# Patient Record
Sex: Male | Born: 1955
Health system: Southern US, Community
[De-identification: ages and names within clinical notes are randomized; demographics above are authoritative.]

## PROBLEM LIST (undated history)

## (undated) ENCOUNTER — Emergency Department (HOSPITAL_COMMUNITY): Admission: EM | Payer: PRIVATE HEALTH INSURANCE

## (undated) DIAGNOSIS — I639 Cerebral infarction, unspecified: Secondary | ICD-10-CM

## (undated) DIAGNOSIS — C801 Malignant (primary) neoplasm, unspecified: Secondary | ICD-10-CM

## (undated) DIAGNOSIS — I10 Essential (primary) hypertension: Secondary | ICD-10-CM

## (undated) HISTORY — PX: PROSTATECTOMY: SHX69

## (undated) HISTORY — DX: Essential (primary) hypertension: I10

## (undated) HISTORY — DX: Malignant (primary) neoplasm, unspecified: C80.1

---

## 1999-09-16 ENCOUNTER — Emergency Department (HOSPITAL_COMMUNITY): Admission: EM | Admit: 1999-09-16 | Discharge: 1999-09-16 | Payer: Self-pay | Admitting: Emergency Medicine

## 2000-09-06 ENCOUNTER — Encounter: Payer: Self-pay | Admitting: General Practice

## 2000-09-06 ENCOUNTER — Encounter: Admission: RE | Admit: 2000-09-06 | Discharge: 2000-09-06 | Payer: Self-pay | Admitting: General Practice

## 2003-07-03 ENCOUNTER — Emergency Department (HOSPITAL_COMMUNITY): Admission: AD | Admit: 2003-07-03 | Discharge: 2003-07-03 | Payer: Self-pay | Admitting: Family Medicine

## 2003-08-02 ENCOUNTER — Encounter: Admission: RE | Admit: 2003-08-02 | Discharge: 2003-09-05 | Payer: Self-pay | Admitting: Nurse Practitioner

## 2009-04-29 ENCOUNTER — Emergency Department (HOSPITAL_BASED_OUTPATIENT_CLINIC_OR_DEPARTMENT_OTHER): Admission: EM | Admit: 2009-04-29 | Discharge: 2009-04-29 | Payer: Self-pay | Admitting: Emergency Medicine

## 2009-04-29 ENCOUNTER — Ambulatory Visit: Payer: Self-pay | Admitting: Radiology

## 2010-06-18 ENCOUNTER — Encounter (INDEPENDENT_AMBULATORY_CARE_PROVIDER_SITE_OTHER): Payer: Self-pay | Admitting: Urology

## 2010-06-18 ENCOUNTER — Inpatient Hospital Stay (HOSPITAL_COMMUNITY)
Admission: RE | Admit: 2010-06-18 | Discharge: 2010-06-19 | Payer: Self-pay | Source: Home / Self Care | Attending: Urology | Admitting: Urology

## 2010-06-20 ENCOUNTER — Inpatient Hospital Stay (HOSPITAL_COMMUNITY)
Admission: EM | Admit: 2010-06-20 | Discharge: 2010-06-26 | Payer: Self-pay | Source: Home / Self Care | Attending: Urology | Admitting: Urology

## 2010-08-04 NOTE — Discharge Summary (Signed)
Ralph Byrd, Ralph Byrd               ACCOUNT NO.:  [id number]  MEDICAL RECORD NO.:  [id number]          PATIENT TYPE:  INP  LOCATION:  1423                         FACILITY:  [facility]  PHYSICIAN:  Valetta Fuller, M.D.  DATE OF BIRTH:  1955-08-02  DATE OF ADMISSION:  06/20/2010 DATE OF DISCHARGE:  06/26/2010                              DISCHARGE SUMMARY   ADMISSION DIAGNOSES: 1. Postoperative ileus, now resolved. 2. Acute abdominal pain. 3. Nonfunctioning indwelling Foley catheter.  DISCHARGE DIAGNOSES: 1. Postoperative ileus, now resolved. 2. Acute abdominal pain. 3. Nonfunctioning indwelling Foley catheter.  HISTORY OF PRESENT ILLNESS:  This is a 55 year old gentleman who is status post robotic-assisted laparoscopic radical prostatectomy on June 18, 2010.  His procedure was uncomplicated, and his hospital course was unremarkable.  He was discharged home from the hospital on June 19, 2010.  At approximately 11:00 p.m. that evening, he began having severe abdominal pain, which was unrelieved with his pain medications.  At that time, he did notice that his Foley catheter had stopped draining.  He had just switched from his leg bag to his large indwelling Foley catheter bag.  He did notice some blood-tinged urine in his bag prior to changing over and prior to the initiation of his symptoms.  He began to complain of nausea with no vomiting at that time. He denies any fever, chills, diarrhea, constipation, chest pain or shortness of breath.  He denies any flatus or bowel movement at this time.  He does complain of hiccups and abdominal distention.  He presented to [facility].  A CT scan of his abdomen and pelvis were obtained, which showed extensive stranding around bladder, which is secondary to his operative procedure.  Also found was mild fullness of the collecting systems bilaterally.  There was moderate ascites, which was best seen adjacent to the liver and  spleen.  His serum creatinine was 1.3 at discharge on June 19, 2010.  In the emergency room, his serum creatinine had subsequently increased to 2.27. His white count is normal, and his urinalysis is normal.  HOSPITAL COURSE:  On June 20, 2010, he was admitted to the hospital for further workup and evaluation.  He was placed nothing by mouth.  He was given medications for pain and for nausea.  He continued to ambulate.  He was given suppositories to try to initiate restart of bowel function.  An acute abdominal series was obtained later that afternoon, which showed gas and stool in proximal colon with no dilated bowel loops.  There was lots of fluid seeping from around one of his incision site in which held a pelvic drain prior to discharge. Therefore, fluid was sent from the dressings at that site and returned at 2.5, which was consistent with urine, suggesting a possible anastomotic leak.  On hospital day #2, a second acute abdominal series was obtained, which showed a borderline dilated small-bowel loops versus focal ileus.  Therefore, a general surgery consult was obtained for this ileus versus a low grade partial small bowel small bowel obstruction. An NG tube was placed prior to request per general surgery.  His  serum creatinine did continue to elevate to 2.75.  He continued to deny any nausea and vomiting.  He also continued not to have bowel movement or pass flatus.  On postoperative day #3, after placement of NG tube, he did state that he felt a little better.  A third acute abdominal series was obtained, which showed no change from the prior day.  His serum creatinine had decreased to 1.85, and his white count continued to be normal.  On hospital day #4, his serum creatinine again decreased to 1.41.  He continued to feel slightly better, although he still was not passing flatus or having bowel movements.  He was started on sips of water and a suppository was given.  He was  encouraged to ambulate.  By hospital day #5, he began passing flatus and having bowel movements.  He stated that he felt much better.  A fourth acute abdominal series was obtained, which did show resolution of dilated small bowel loops compared to his prior exam.  Therefore, his NG tube was clamped until later that afternoon.  Later that afternoon, he continued to feel much better, continued to have bowel movements and denies any nausea or vomiting.  Therefore, his NG tube was removed.  He did have some coffee grounds from his NG tube.  Per general surgery, they were tested and guaiac positive.  General Surgery is aware that.  On hospital day #6, his serum creatinine continued to decrease to 1.29.  He continued to have bowel movements and flatus.  By hospital day #7, his ileus continued to resolve.  He continued to pass gas and have bowel movements.  He was tolerating a clear liquid diet.  He was ambulating without difficulty and his pain was well managed.  Therefore, he was felt to be stable for discharge home as he had met all discharge criteria.  DISCHARGE INSTRUCTIONS:  He was instructed to be ambulatory but specifically told to refrain from any heavy lifting, strenuous activity or driving.  He was again instructed on routine Foley catheter care and told to gradually advance his diet over the course of the next 48 hours.  DISCHARGE MEDICATIONS:  He was instructed to resume all home medications.  In addition, he was provided a prescription for Vicodin to use as needed for pain and told to use Colace as a stool softener.  FOLLOWUP:  He will follow up in 3-5 days for removal of Foley catheter and skin staples.     Delia Chimes, NP   ______________________________ Valetta Fuller, M.D.    MA/MEDQ  D:  07/23/2010  T:  07/23/2010  Job:  [id number]  Electronically Signed by Delia Chimes NP on 07/25/2010 09:46:02 AM Electronically Signed by Barron Alvine M.D. on 08/04/2010  09:21:18 AM

## 2010-09-23 LAB — BASIC METABOLIC PANEL
BUN: 11 mg/dL (ref 6–23)
BUN: 12 mg/dL (ref 6–23)
BUN: 14 mg/dL (ref 6–23)
CO2: 22 mEq/L (ref 19–32)
CO2: 23 mEq/L (ref 19–32)
CO2: 24 mEq/L (ref 19–32)
Calcium: 8.1 mg/dL — ABNORMAL LOW (ref 8.4–10.5)
Calcium: 8.3 mg/dL — ABNORMAL LOW (ref 8.4–10.5)
Calcium: 8.3 mg/dL — ABNORMAL LOW (ref 8.4–10.5)
Calcium: 8.5 mg/dL (ref 8.4–10.5)
Calcium: 8.7 mg/dL (ref 8.4–10.5)
Chloride: 108 mEq/L (ref 96–112)
Chloride: 108 mEq/L (ref 96–112)
Creatinine, Ser: 1.39 mg/dL (ref 0.4–1.5)
Creatinine, Ser: 1.41 mg/dL (ref 0.4–1.5)
Creatinine, Ser: 1.85 mg/dL — ABNORMAL HIGH (ref 0.4–1.5)
GFR calc Af Amer: 54 mL/min — ABNORMAL LOW (ref >60)
GFR calc Af Amer: >60 mL/min (ref >60)
GFR calc non Af Amer: 38 mL/min — ABNORMAL LOW (ref >60)
GFR calc non Af Amer: 52 mL/min — ABNORMAL LOW (ref >60)
GFR calc non Af Amer: 53 mL/min — ABNORMAL LOW (ref >60)
GFR calc non Af Amer: 58 mL/min — ABNORMAL LOW (ref >60)
Glucose, Bld: 117 mg/dL — ABNORMAL HIGH (ref 70–99)
Glucose, Bld: 122 mg/dL — ABNORMAL HIGH (ref 70–99)
Glucose, Bld: 135 mg/dL — ABNORMAL HIGH (ref 70–99)
Glucose, Bld: 142 mg/dL — ABNORMAL HIGH (ref 70–99)
Glucose, Bld: 148 mg/dL — ABNORMAL HIGH (ref 70–99)
Potassium: 4.6 mEq/L (ref 3.5–5.1)
Sodium: 138 mEq/L (ref 135–145)

## 2010-09-23 LAB — COMPREHENSIVE METABOLIC PANEL
ALT: 20 U/L (ref 0–53)
Alkaline Phosphatase: 63 U/L (ref 39–117)
BUN: 14 mg/dL (ref 6–23)
CO2: 27 mEq/L (ref 19–32)
Calcium: 8.1 mg/dL — ABNORMAL LOW (ref 8.4–10.5)
Calcium: 9.4 mg/dL (ref 8.4–10.5)
Creatinine, Ser: 1.32 mg/dL (ref 0.4–1.5)
GFR calc Af Amer: >60 mL/min (ref >60)
GFR calc non Af Amer: 56 mL/min — ABNORMAL LOW (ref >60)
Glucose, Bld: 124 mg/dL — ABNORMAL HIGH (ref 70–99)
Potassium: 4.2 mEq/L (ref 3.5–5.1)
Sodium: 142 mEq/L (ref 135–145)
Total Bilirubin: 0.7 mg/dL (ref 0.3–1.2)
Total Bilirubin: 0.8 mg/dL (ref 0.3–1.2)
Total Protein: 6.2 g/dL (ref 6.0–8.3)
Total Protein: 7.4 g/dL (ref 6.0–8.3)

## 2010-09-23 LAB — DIFFERENTIAL
Basophils Absolute: 0 10*3/uL (ref 0.0–0.1)
Basophils Absolute: 0 10*3/uL (ref 0.0–0.1)
Basophils Relative: 0 % (ref 0–1)
Basophils Relative: 0 % (ref 0–1)
Eosinophils Absolute: 0 10*3/uL (ref 0.0–0.7)
Eosinophils Absolute: 0 10*3/uL (ref 0.0–0.7)
Eosinophils Absolute: 0.1 10*3/uL (ref 0.0–0.7)
Eosinophils Relative: 0 % (ref 0–5)
Eosinophils Relative: 0 % (ref 0–5)
Lymphs Abs: 1.1 10*3/uL (ref 0.7–4.0)
Monocytes Relative: 10 % (ref 3–12)
Monocytes Relative: 2 % — ABNORMAL LOW (ref 3–12)
Monocytes Relative: 6 % (ref 3–12)
Monocytes Relative: 8 % (ref 3–12)
Neutro Abs: 8.2 10*3/uL — ABNORMAL HIGH (ref 1.7–7.7)
Neutrophils Relative %: 80 % — ABNORMAL HIGH (ref 43–77)
Neutrophils Relative %: 88 % — ABNORMAL HIGH (ref 43–77)

## 2010-09-23 LAB — CBC
HCT: 32.3 % — ABNORMAL LOW (ref 39.0–52.0)
HCT: 36.2 % — ABNORMAL LOW (ref 39.0–52.0)
HCT: 37.3 % — ABNORMAL LOW (ref 39.0–52.0)
HCT: 39.8 % (ref 39.0–52.0)
HCT: 43.6 % (ref 39.0–52.0)
Hemoglobin: 11.1 g/dL — ABNORMAL LOW (ref 13.0–17.0)
Hemoglobin: 12.4 g/dL — ABNORMAL LOW (ref 13.0–17.0)
Hemoglobin: 13.8 g/dL (ref 13.0–17.0)
Hemoglobin: 15.4 g/dL (ref 13.0–17.0)
MCH: 29 pg (ref 26.0–34.0)
MCH: 29.1 pg (ref 26.0–34.0)
MCH: 29.5 pg (ref 26.0–34.0)
MCHC: 33.7 g/dL (ref 30.0–36.0)
MCHC: 33.9 g/dL (ref 30.0–36.0)
MCHC: 34 g/dL (ref 30.0–36.0)
MCHC: 34.4 g/dL (ref 30.0–36.0)
MCHC: 34.7 g/dL (ref 30.0–36.0)
MCV: 85 fL (ref 78.0–100.0)
MCV: 85.3 fL (ref 78.0–100.0)
MCV: 85.9 fL (ref 78.0–100.0)
Platelets: 167 10*3/uL (ref 150–400)
Platelets: 177 10*3/uL (ref 150–400)
Platelets: 194 10*3/uL (ref 150–400)
Platelets: 210 10*3/uL (ref 150–400)
Platelets: 225 10*3/uL (ref 150–400)
Platelets: 235 10*3/uL (ref 150–400)
RBC: 3.62 MIL/uL — ABNORMAL LOW (ref 4.22–5.81)
RBC: 4.68 MIL/uL (ref 4.22–5.81)
RBC: 5.11 MIL/uL (ref 4.22–5.81)
RDW: 12.7 % (ref 11.5–15.5)
RDW: 12.8 % (ref 11.5–15.5)
RDW: 12.8 % (ref 11.5–15.5)
RDW: 12.8 % (ref 11.5–15.5)
RDW: 12.9 % (ref 11.5–15.5)
RDW: 13 % (ref 11.5–15.5)
WBC: 4.2 10*3/uL (ref 4.0–10.5)
WBC: 6.9 10*3/uL (ref 4.0–10.5)
WBC: 7.6 10*3/uL (ref 4.0–10.5)

## 2010-09-23 LAB — URINALYSIS, ROUTINE W REFLEX MICROSCOPIC
Ketones, ur: NEGATIVE mg/dL
Urobilinogen, UA: 0.2 mg/dL (ref 0.0–1.0)
pH: 6 (ref 5.0–8.0)

## 2010-09-23 LAB — POCT I-STAT, CHEM 8
BUN: 18 mg/dL (ref 6–23)
Calcium, Ion: 1.01 mmol/L — ABNORMAL LOW (ref 1.12–1.32)
Creatinine, Ser: 2.5 mg/dL — ABNORMAL HIGH (ref 0.4–1.5)
Glucose, Bld: 140 mg/dL — ABNORMAL HIGH (ref 70–99)
HCT: 37 % — ABNORMAL LOW (ref 39.0–52.0)
Hemoglobin: 12.6 g/dL — ABNORMAL LOW (ref 13.0–17.0)
Potassium: 4 mEq/L (ref 3.5–5.1)
Sodium: 134 mEq/L — ABNORMAL LOW (ref 135–145)
TCO2: 24 mmol/L (ref 0–100)

## 2010-09-23 LAB — SURGICAL PCR SCREEN: MRSA, PCR: NEGATIVE

## 2010-09-23 LAB — URINE CULTURE: Culture  Setup Time: 201112090831

## 2010-09-23 LAB — CREATININE, FLUID (PLEURAL, PERITONEAL, JP DRAINAGE)

## 2010-09-23 LAB — OCCULT BLOOD GASTRIC / DUODENUM (SPECIMEN CUP): pH, Gastric: 2

## 2010-09-23 LAB — TYPE AND SCREEN

## 2010-09-23 LAB — URINE MICROSCOPIC-ADD ON

## 2011-08-24 IMAGING — CT CT ABD-PELV W/O CM
2 of 4 series · 17 of 46 positions shown, 19 images · non-contrast
Comparison: None

CLINICAL DATA: Abdominal pain.  Distention.  Elevated creatinine.
History of prostate cancer, status post prostatectomy 06/18/2010.

CT ABDOMEN AND PELVIS WITHOUT CONTRAST
TECHNIQUE: Multidetector CT imaging of the abdomen and pelvis was
performed following the standard protocol without intravenous
contrast.

[Series 2: rtn a/p w/o · axial · non-contrast · 0.73mm/px · z∈[-704,-274]mm · 14 of 96 slices shown, 16 images]
[im 5/96  soft-tissue]
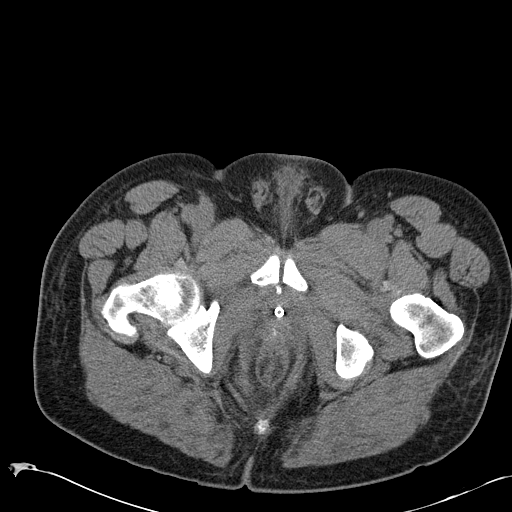
[im 5/96  bone]
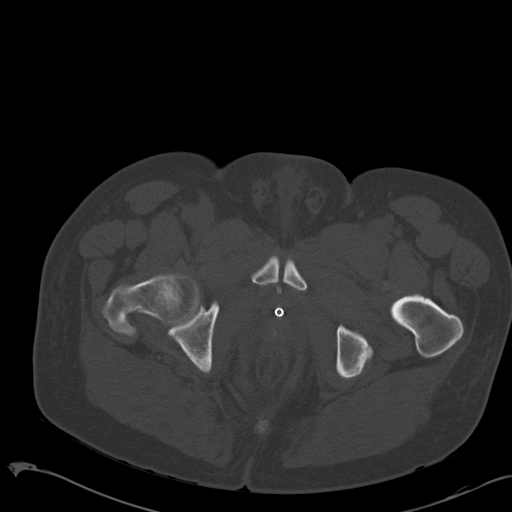
[im 13/96  soft-tissue]
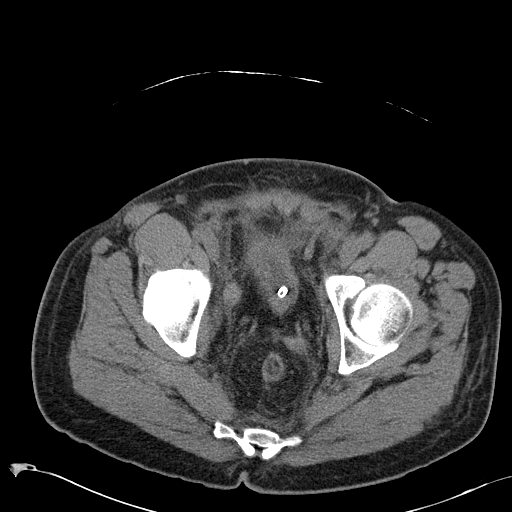
[im 17/96  soft-tissue]
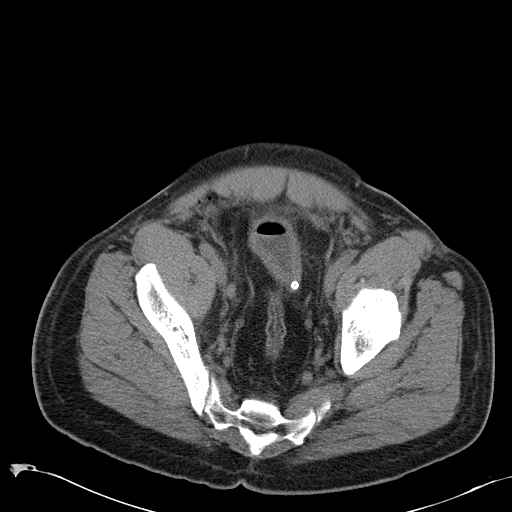
[im 25/96  soft-tissue]
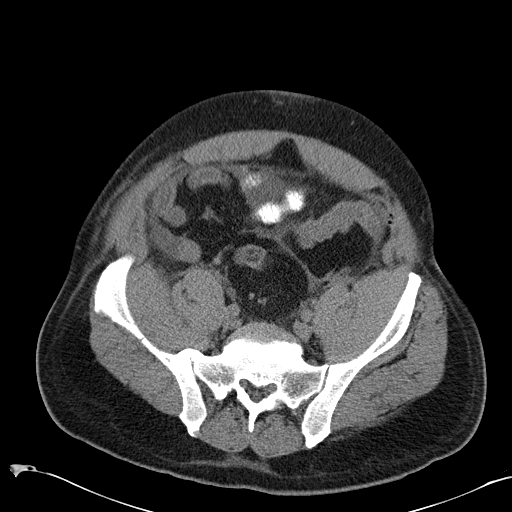
[im 34/96  soft-tissue]
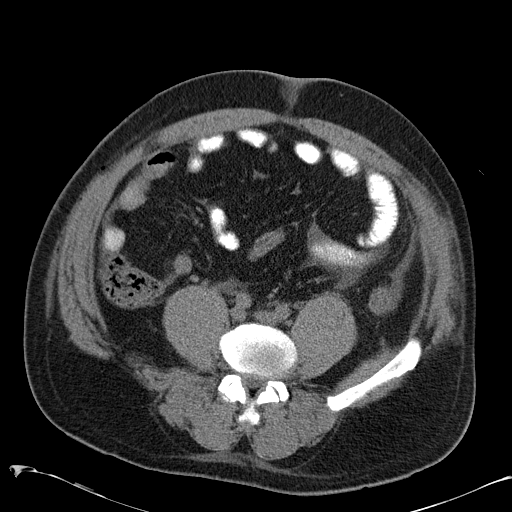
[im 38/96  soft-tissue]
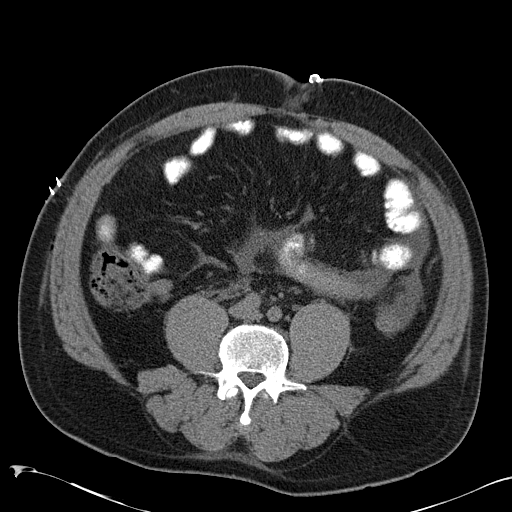
[im 46/96  soft-tissue]
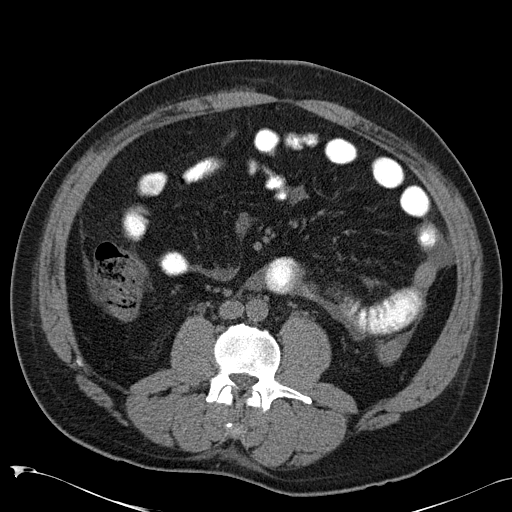
[im 50/96  soft-tissue]
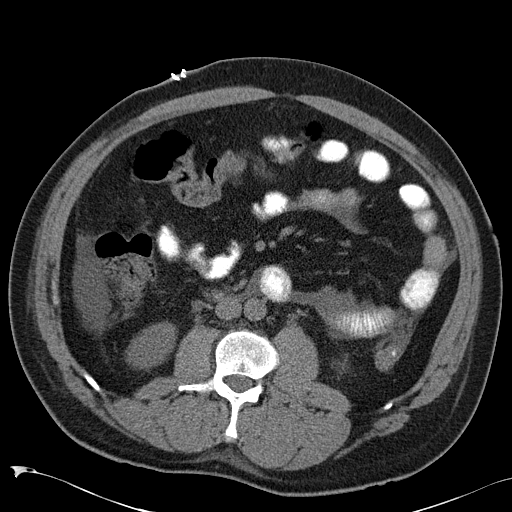
[im 58/96  soft-tissue]
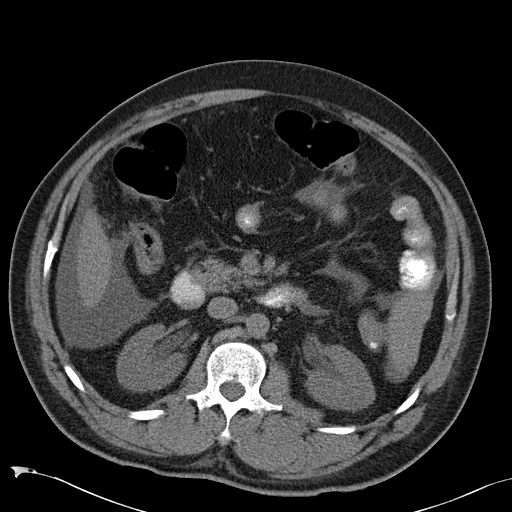
[im 58/96  bone]
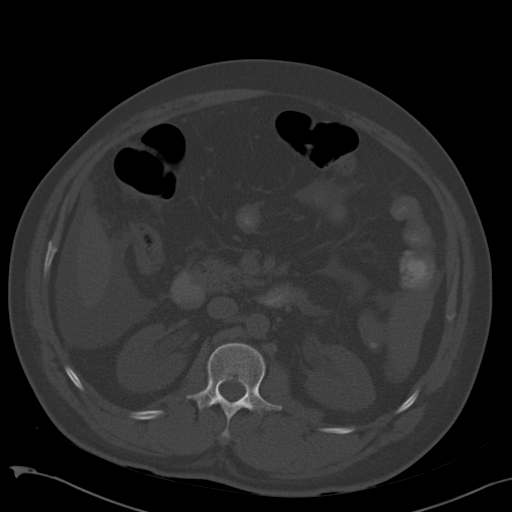
[im 62/96  soft-tissue]
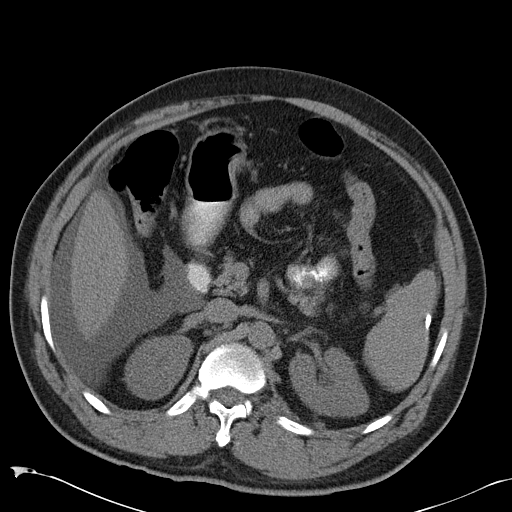
[im 71/96  soft-tissue]
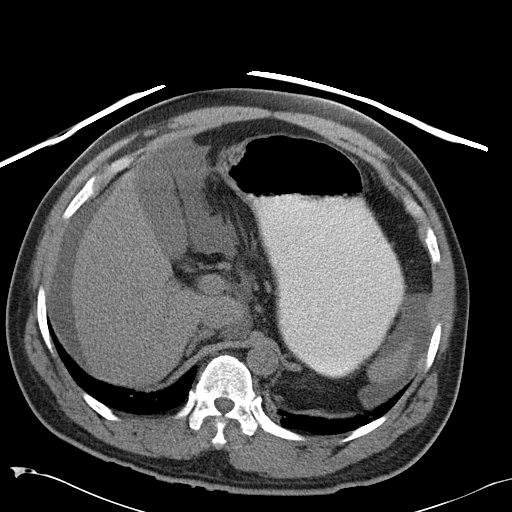
[im 79/96  soft-tissue]
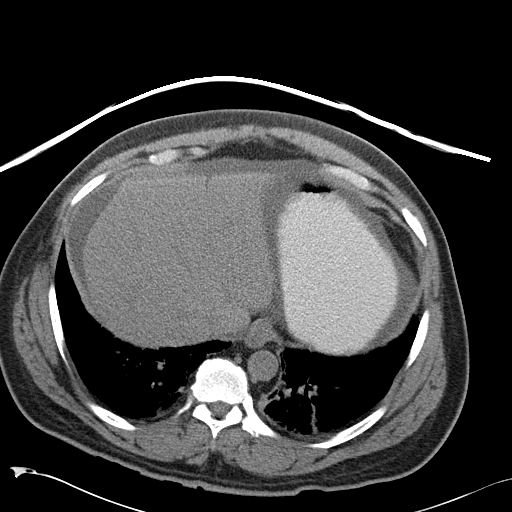
[im 83/96  soft-tissue]
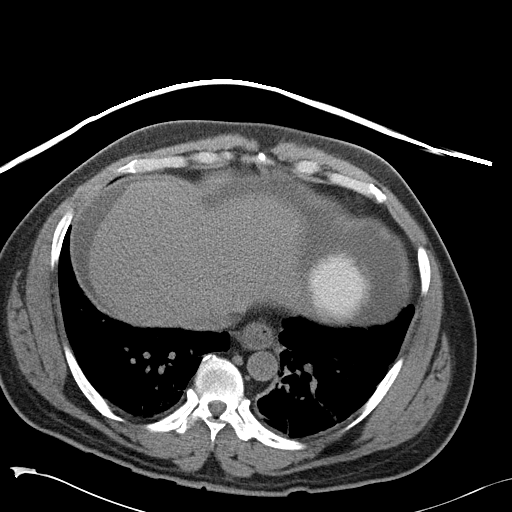
[im 91/96  soft-tissue]
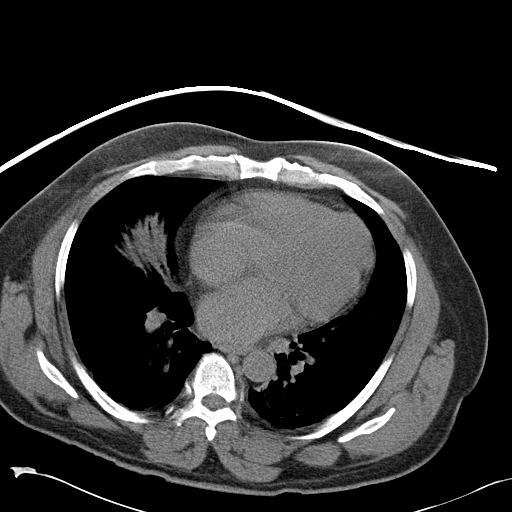

[Series 602: <mpr thick range> · coronal · 0.94mm/px · 3 of 89 slices shown]
[im 30/89  soft-tissue]
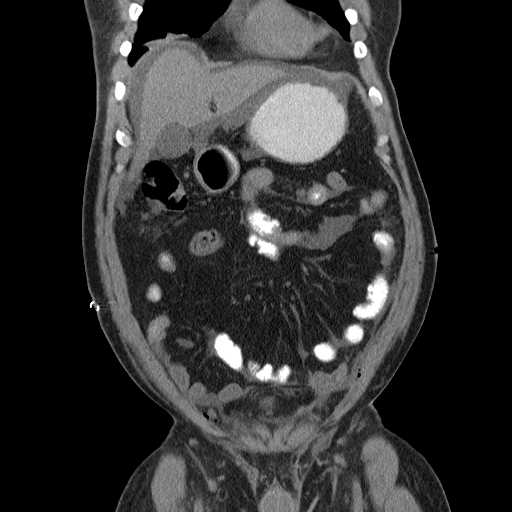
[im 40/89  soft-tissue]
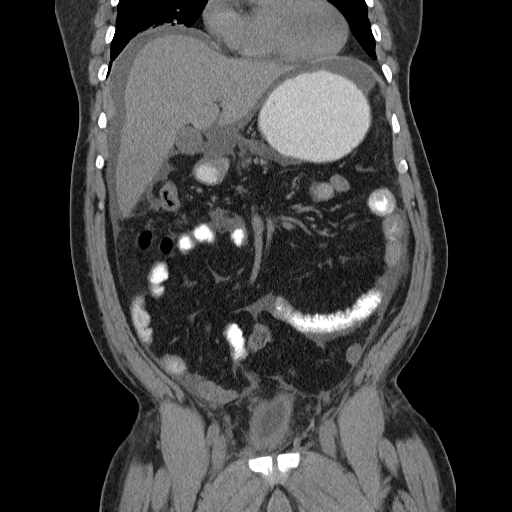
[im 49/89  soft-tissue]
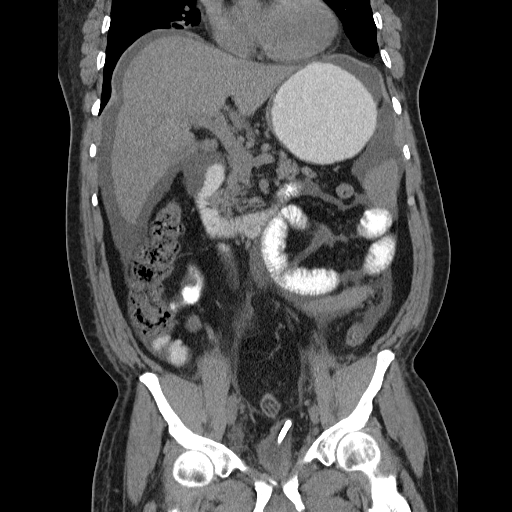

[17 of 46 positions shown; findings below may reference images not displayed]

FINDINGS: Bibasilar opacities are noted, likely atelectasis.  Heart
is mildly enlarged.  No effusions.

Moderate ascites noted in the abdomen, most visible in the
perihepatic and perisplenic regions. There is a catheter present
the bladder which is decompressed.  Bladder wall appears thickened.
There is new stranding in the lower pelvis around the bladder,
presumably due to the recent postoperative state from prior
prostatectomy.  There is a small amount of high-density material
within the pelvis, to the right of the bladder and in the left side
of the cul-de-sac, likely blood/hematoma.  There are a few locules
of extraluminal gas in the pelvis, also presumably postoperative.

Liver, spleen, pancreas, adrenals have an unremarkable unenhanced
appearance.  Mild fullness of the renal collecting systems
bilaterally.  Large and small bowel grossly unremarkable.
IMPRESSION: Extensive stranding around the bladder.  Locules of gas noted
anteriorly in the pelvis.  Small high density collections are seen
to the right of the bladder and in the left cul-de-sac.  This
presumably represents blood/hematoma.  These findings are
presumably related to the recent postoperative state.

Moderate ascites, best seen adjacent to the liver and spleen.

Mild fullness of the collecting systems bilaterally.

Bibasilar atelectasis.

## 2011-08-26 IMAGING — CR DG ABDOMEN ACUTE W/ 1V CHEST
3 series · 3 of 3 positions shown · non-contrast
Comparison: 06/21/2010

CLINICAL DATA: Abdominal distention and pain.  Status post
prostatectomy.

ACUTE ABDOMEN SERIES (ABDOMEN 2 VIEW & CHEST 1 VIEW)

[w chest pa]
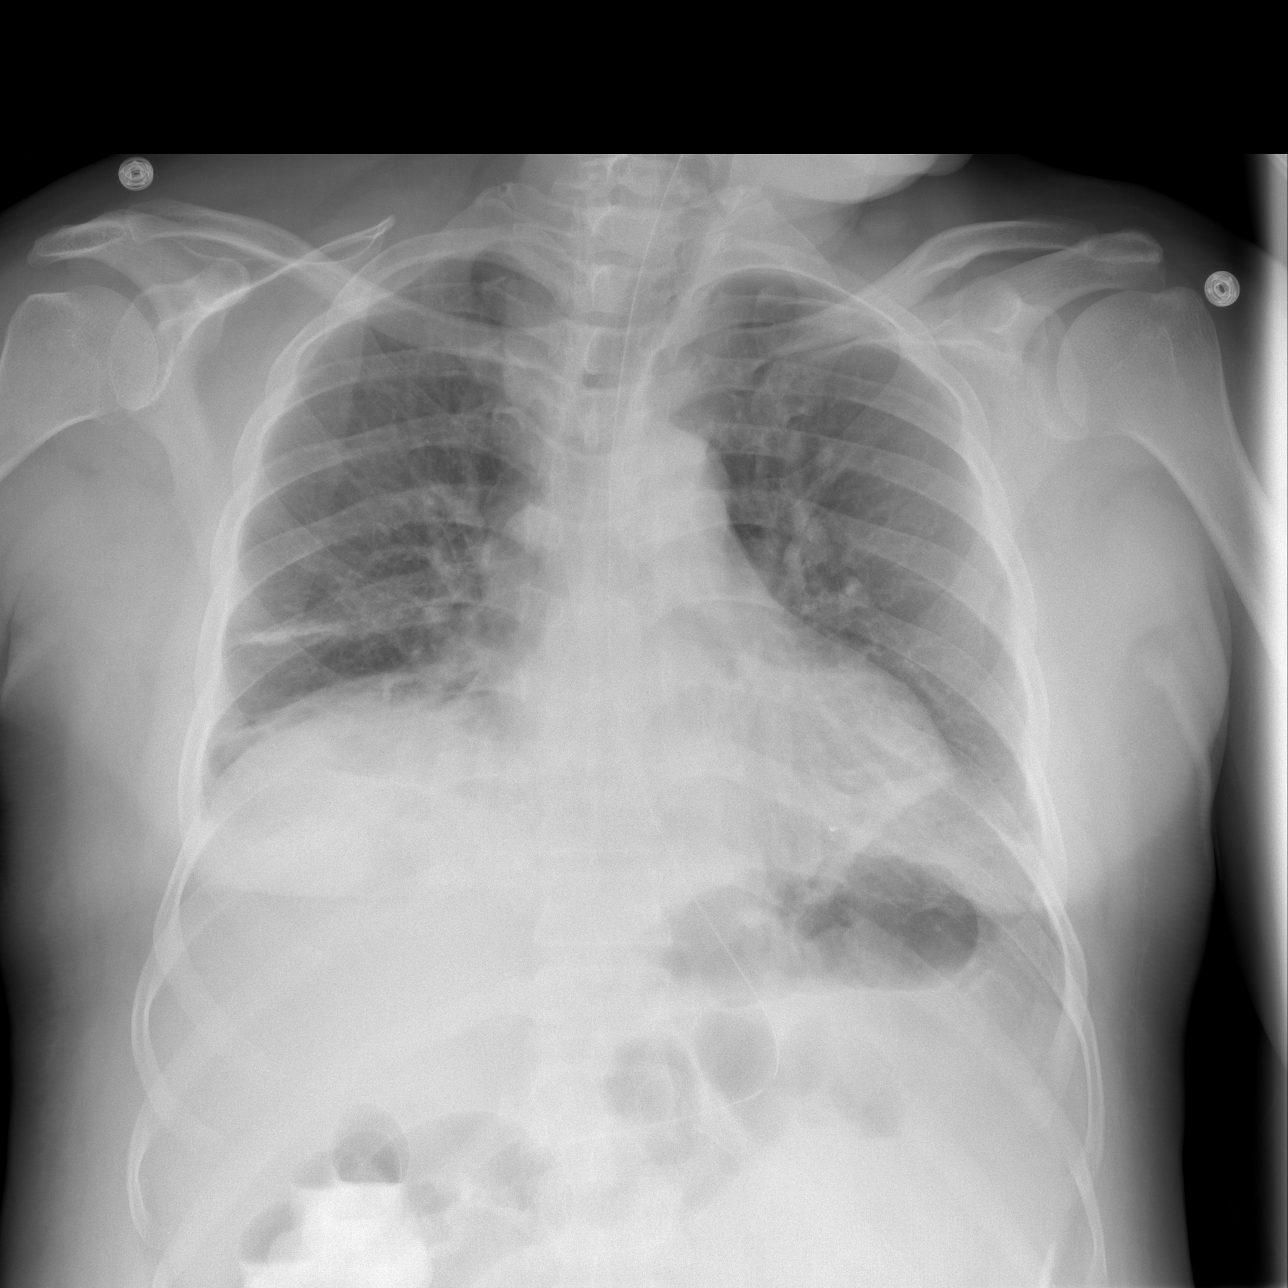

[w abdomen upright *]
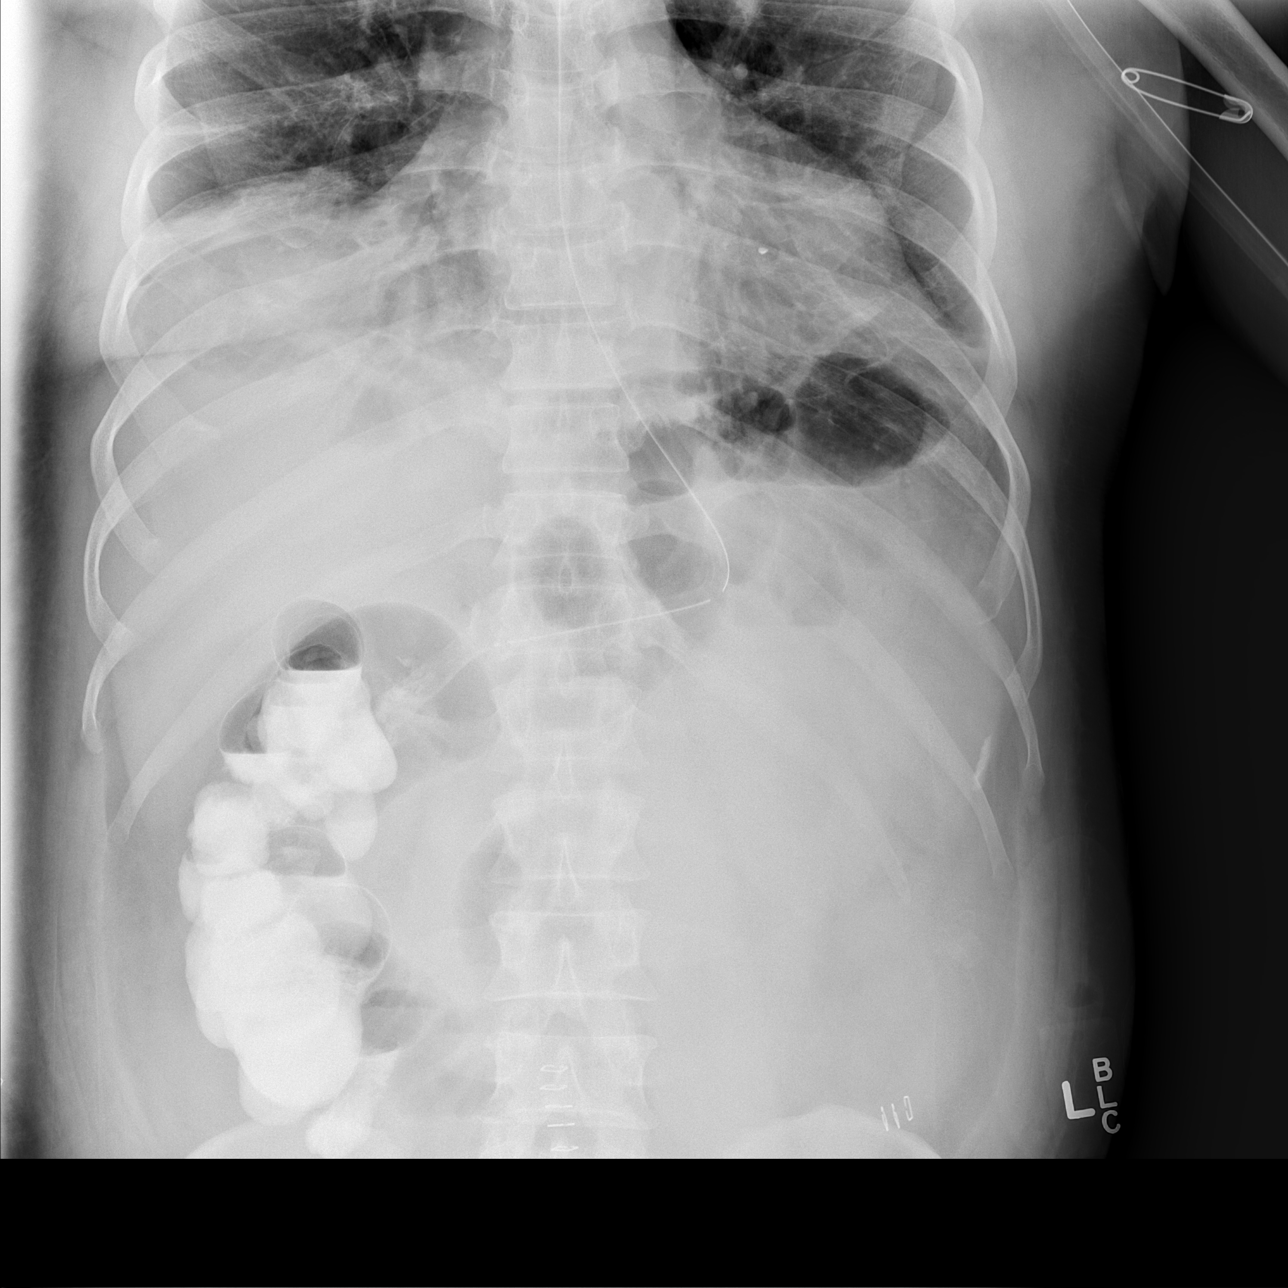

[t abdomen supine *]
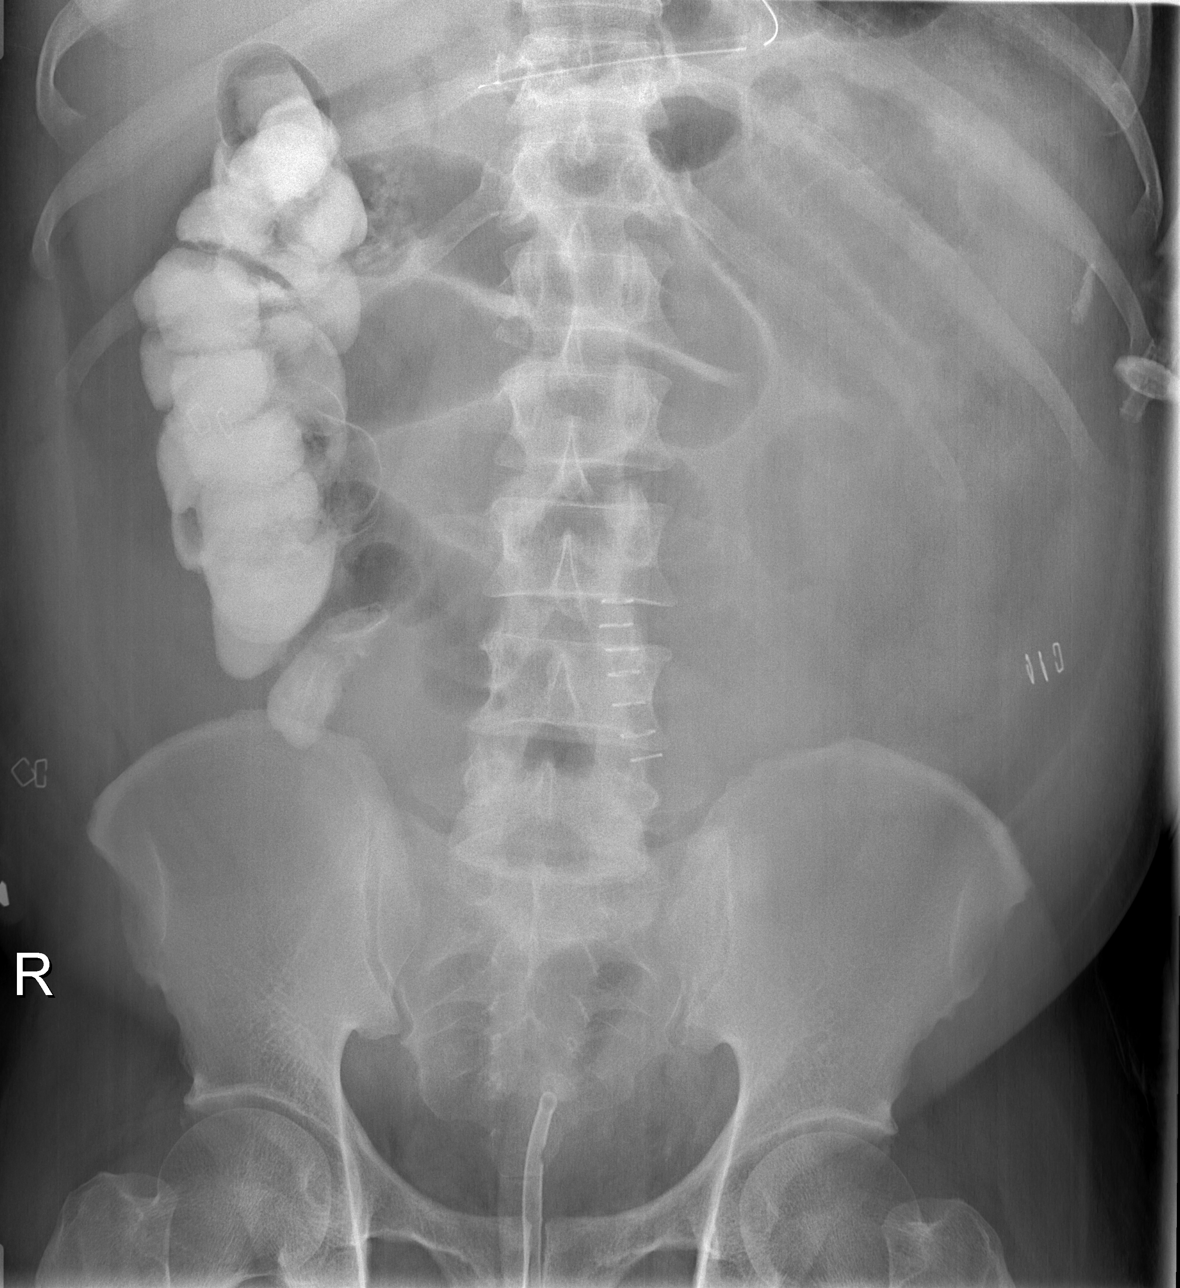

[3 of 3 positions shown; findings below may reference images not displayed]

FINDINGS: No change in low lung volumes with right greater than
left lower lobe plate-like atelectasis.  Nasogastric tube is
appropriately positioned.  No free air beneath the diaphragms.

No significant change in contrast within nondilated right colon.
Stable borderline dilated mid left abdominal loops of small bowel.
A few differential air-fluid levels are again noted.  Abdominal
staples are present.
IMPRESSION: No significant change in several mid left abdominal borderline
dilated small bowel loops of differential air-fluid levels, likely
partial / early small bowel obstruction or focal ileus.

## 2012-09-23 ENCOUNTER — Other Ambulatory Visit (HOSPITAL_COMMUNITY): Payer: Self-pay | Admitting: Family Medicine

## 2012-09-23 DIAGNOSIS — R269 Unspecified abnormalities of gait and mobility: Secondary | ICD-10-CM

## 2012-09-23 DIAGNOSIS — M6281 Muscle weakness (generalized): Secondary | ICD-10-CM

## 2012-09-24 ENCOUNTER — Ambulatory Visit (HOSPITAL_COMMUNITY)
Admission: RE | Admit: 2012-09-24 | Discharge: 2012-09-24 | Disposition: A | Discharge status: Home / Self Care | Payer: 59 | Source: Ambulatory Visit | Attending: Family Medicine | Admitting: Family Medicine

## 2012-09-24 DIAGNOSIS — I6350 Cerebral infarction due to unspecified occlusion or stenosis of unspecified cerebral artery: Secondary | ICD-10-CM | POA: Insufficient documentation

## 2012-09-24 DIAGNOSIS — R269 Unspecified abnormalities of gait and mobility: Secondary | ICD-10-CM

## 2012-09-24 DIAGNOSIS — M6281 Muscle weakness (generalized): Secondary | ICD-10-CM

## 2012-09-24 DIAGNOSIS — R29898 Other symptoms and signs involving the musculoskeletal system: Secondary | ICD-10-CM | POA: Insufficient documentation

## 2012-09-24 MED ORDER — GADOBENATE DIMEGLUMINE 529 MG/ML IV SOLN
20.0000 mL | Freq: Once | INTRAVENOUS | Status: AC | PRN
  Administered 2012-09-24: 20 mL via INTRAVENOUS

## 2012-09-26 ENCOUNTER — Other Ambulatory Visit (HOSPITAL_COMMUNITY): Payer: Self-pay

## 2012-10-10 ENCOUNTER — Other Ambulatory Visit (HOSPITAL_COMMUNITY): Payer: Self-pay | Admitting: Family Medicine

## 2012-10-10 ENCOUNTER — Other Ambulatory Visit (HOSPITAL_COMMUNITY): Payer: Self-pay | Admitting: Unknown Physician Specialty

## 2012-10-10 DIAGNOSIS — I6350 Cerebral infarction due to unspecified occlusion or stenosis of unspecified cerebral artery: Secondary | ICD-10-CM

## 2012-10-13 ENCOUNTER — Ambulatory Visit (HOSPITAL_COMMUNITY)
Admission: RE | Admit: 2012-10-13 | Discharge: 2012-10-13 | Disposition: A | Discharge status: Home / Self Care | Payer: 59 | Source: Ambulatory Visit | Attending: Family Medicine | Admitting: Family Medicine

## 2012-10-13 ENCOUNTER — Ambulatory Visit: Payer: 59 | Admitting: Physical Therapy

## 2012-10-13 DIAGNOSIS — I6350 Cerebral infarction due to unspecified occlusion or stenosis of unspecified cerebral artery: Secondary | ICD-10-CM

## 2012-10-13 DIAGNOSIS — Z8673 Personal history of transient ischemic attack (TIA), and cerebral infarction without residual deficits: Secondary | ICD-10-CM | POA: Insufficient documentation

## 2012-10-13 NOTE — Progress Notes (Signed)
*  PRELIMINARY RESULTS* Vascular Ultrasound Carotid Duplex (Doppler) has been completed.   Bilateral internal carotid arteries demonstrate elevated velocities, however this is likely due to tortuosity. Bilateral carotid sinuses are slightly dilated. Vertebral arteries are patent with antegrade flow.  10/13/2012 11:42 AM Gertie Fey, RDMS, RDCS

## 2012-10-20 ENCOUNTER — Ambulatory Visit: Payer: 59 | Attending: Family Medicine | Admitting: Physical Therapy

## 2012-10-20 DIAGNOSIS — R269 Unspecified abnormalities of gait and mobility: Secondary | ICD-10-CM | POA: Insufficient documentation

## 2012-10-20 DIAGNOSIS — IMO0001 Reserved for inherently not codable concepts without codable children: Secondary | ICD-10-CM | POA: Insufficient documentation

## 2015-07-16 DIAGNOSIS — Z8546 Personal history of malignant neoplasm of prostate: Secondary | ICD-10-CM | POA: Diagnosis not present

## 2015-07-22 DIAGNOSIS — Z8546 Personal history of malignant neoplasm of prostate: Secondary | ICD-10-CM | POA: Diagnosis not present

## 2015-07-22 DIAGNOSIS — N5201 Erectile dysfunction due to arterial insufficiency: Secondary | ICD-10-CM | POA: Diagnosis not present

## 2015-07-22 DIAGNOSIS — Z0000 Encounter for general adult medical examination without abnormal findings: Secondary | ICD-10-CM | POA: Diagnosis not present

## 2015-08-26 MED FILL — HYDROCHLOROTHIAZIDE 25 MG T: 25 | 90 days supply | Qty: 90 | Fill #0

## 2015-09-03 MED FILL — PRAVASTATIN SODIUM 20 MG TA: 20 | 90 days supply | Qty: 90 | Fill #0

## 2015-09-16 MED FILL — AMLODIPINE BESYLATE 5 MG TA: 5 | 90 days supply | Qty: 90 | Fill #0

## 2015-10-02 MED FILL — CLOPIDOGREL 75 MG TABLET: 75 | 90 days supply | Qty: 90 | Fill #1

## 2015-10-31 DIAGNOSIS — N529 Male erectile dysfunction, unspecified: Secondary | ICD-10-CM | POA: Diagnosis not present

## 2015-10-31 DIAGNOSIS — E7800 Pure hypercholesterolemia, unspecified: Secondary | ICD-10-CM | POA: Diagnosis not present

## 2015-10-31 DIAGNOSIS — R7301 Impaired fasting glucose: Secondary | ICD-10-CM | POA: Diagnosis not present

## 2015-10-31 DIAGNOSIS — Z23 Encounter for immunization: Secondary | ICD-10-CM | POA: Diagnosis not present

## 2015-10-31 DIAGNOSIS — I639 Cerebral infarction, unspecified: Secondary | ICD-10-CM | POA: Diagnosis not present

## 2015-10-31 DIAGNOSIS — R21 Rash and other nonspecific skin eruption: Secondary | ICD-10-CM | POA: Diagnosis not present

## 2015-10-31 DIAGNOSIS — I10 Essential (primary) hypertension: Secondary | ICD-10-CM | POA: Diagnosis not present

## 2015-10-31 DIAGNOSIS — Z8546 Personal history of malignant neoplasm of prostate: Secondary | ICD-10-CM | POA: Diagnosis not present

## 2015-10-31 MED FILL — TRIAMCINOLONE 0.1% CREAM: 0.1 | 14 days supply | Qty: 30 | Fill #0

## 2015-11-25 MED FILL — HYDROCHLOROTHIAZIDE 25 MG T: 25 | 90 days supply | Qty: 90 | Fill #0

## 2015-12-17 MED FILL — CLOPIDOGREL 75 MG TABLET: 75 | 90 days supply | Qty: 90 | Fill #0

## 2015-12-17 MED FILL — AMLODIPINE BESYLATE 5 MG TA: 5 | 90 days supply | Qty: 90 | Fill #0

## 2016-01-15 DIAGNOSIS — L509 Urticaria, unspecified: Secondary | ICD-10-CM | POA: Diagnosis not present

## 2016-02-03 MED FILL — PRAVASTATIN SODIUM 20 MG TA: 20 | 90 days supply | Qty: 90 | Fill #0

## 2016-03-04 MED FILL — HYDROCHLOROTHIAZIDE 25 MG T: 25 | 90 days supply | Qty: 90 | Fill #1

## 2016-03-18 MED FILL — AMLODIPINE BESYLATE 5 MG TA: 5 | 90 days supply | Qty: 90 | Fill #1

## 2016-03-18 MED FILL — CLOPIDOGREL 75 MG TABLET: 75 | 90 days supply | Qty: 90 | Fill #1

## 2016-05-01 DIAGNOSIS — R7301 Impaired fasting glucose: Secondary | ICD-10-CM | POA: Diagnosis not present

## 2016-05-01 DIAGNOSIS — E7800 Pure hypercholesterolemia, unspecified: Secondary | ICD-10-CM | POA: Diagnosis not present

## 2016-05-01 DIAGNOSIS — I10 Essential (primary) hypertension: Secondary | ICD-10-CM | POA: Diagnosis not present

## 2016-05-01 DIAGNOSIS — Z8546 Personal history of malignant neoplasm of prostate: Secondary | ICD-10-CM | POA: Diagnosis not present

## 2016-05-01 DIAGNOSIS — N529 Male erectile dysfunction, unspecified: Secondary | ICD-10-CM | POA: Diagnosis not present

## 2016-05-01 DIAGNOSIS — Z8673 Personal history of transient ischemic attack (TIA), and cerebral infarction without residual deficits: Secondary | ICD-10-CM | POA: Diagnosis not present

## 2016-06-01 MED FILL — PRAVASTATIN SODIUM 20 MG TA: 20 | 90 days supply | Qty: 90 | Fill #1

## 2016-06-01 MED FILL — HYDROCHLOROTHIAZIDE 25 MG T: 25 | 90 days supply | Qty: 90 | Fill #2

## 2016-06-26 MED FILL — AMLODIPINE BESYLATE 5 MG TA: 5 | 90 days supply | Qty: 90 | Fill #2

## 2016-06-26 MED FILL — CLOPIDOGREL 75 MG TABLET: 75 | 90 days supply | Qty: 90 | Fill #2

## 2016-07-22 DIAGNOSIS — Z8546 Personal history of malignant neoplasm of prostate: Secondary | ICD-10-CM | POA: Diagnosis not present

## 2016-08-05 DIAGNOSIS — N5201 Erectile dysfunction due to arterial insufficiency: Secondary | ICD-10-CM | POA: Diagnosis not present

## 2016-08-05 DIAGNOSIS — Z8546 Personal history of malignant neoplasm of prostate: Secondary | ICD-10-CM | POA: Diagnosis not present

## 2016-09-07 MED FILL — HYDROCHLOROTHIAZIDE 25 MG T: 25 | 90 days supply | Qty: 90 | Fill #3

## 2016-09-29 MED FILL — CLOPIDOGREL 75 MG TABLET: 75 | 90 days supply | Qty: 90 | Fill #3

## 2016-09-29 MED FILL — AMLODIPINE BESYLATE 5 MG TA: 5 | 90 days supply | Qty: 90 | Fill #3

## 2016-10-30 ENCOUNTER — Other Ambulatory Visit: Payer: Self-pay | Admitting: Family Medicine

## 2016-10-30 ENCOUNTER — Ambulatory Visit
Admission: RE | Admit: 2016-10-30 | Discharge: 2016-10-30 | Disposition: A | Discharge status: Home / Self Care | Payer: 59 | Source: Ambulatory Visit | Attending: Family Medicine | Admitting: Family Medicine

## 2016-10-30 DIAGNOSIS — M25551 Pain in right hip: Secondary | ICD-10-CM | POA: Diagnosis not present

## 2016-10-30 DIAGNOSIS — Z8546 Personal history of malignant neoplasm of prostate: Secondary | ICD-10-CM | POA: Diagnosis not present

## 2016-10-30 DIAGNOSIS — E7800 Pure hypercholesterolemia, unspecified: Secondary | ICD-10-CM | POA: Diagnosis not present

## 2016-10-30 DIAGNOSIS — M47816 Spondylosis without myelopathy or radiculopathy, lumbar region: Secondary | ICD-10-CM | POA: Diagnosis not present

## 2016-10-30 DIAGNOSIS — Z8673 Personal history of transient ischemic attack (TIA), and cerebral infarction without residual deficits: Secondary | ICD-10-CM | POA: Diagnosis not present

## 2016-10-30 DIAGNOSIS — M5440 Lumbago with sciatica, unspecified side: Secondary | ICD-10-CM | POA: Diagnosis not present

## 2016-10-30 DIAGNOSIS — R52 Pain, unspecified: Secondary | ICD-10-CM

## 2016-10-30 DIAGNOSIS — N529 Male erectile dysfunction, unspecified: Secondary | ICD-10-CM | POA: Diagnosis not present

## 2016-10-30 DIAGNOSIS — I10 Essential (primary) hypertension: Secondary | ICD-10-CM | POA: Diagnosis not present

## 2016-10-30 DIAGNOSIS — R7301 Impaired fasting glucose: Secondary | ICD-10-CM | POA: Diagnosis not present

## 2016-10-30 DIAGNOSIS — M25552 Pain in left hip: Secondary | ICD-10-CM | POA: Diagnosis not present

## 2016-10-30 MED FILL — PRAVASTATIN SODIUM 20 MG TA: 20 | 90 days supply | Qty: 90 | Fill #0

## 2016-10-30 MED FILL — predniSONE 20 MG TABS: 20 | 9 days supply | Qty: 18 | Fill #0

## 2016-11-17 ENCOUNTER — Other Ambulatory Visit (HOSPITAL_COMMUNITY): Payer: Self-pay | Admitting: Family Medicine

## 2016-11-17 DIAGNOSIS — M5440 Lumbago with sciatica, unspecified side: Principal | ICD-10-CM

## 2016-11-17 DIAGNOSIS — M545 Low back pain, unspecified: Secondary | ICD-10-CM

## 2016-11-23 ENCOUNTER — Ambulatory Visit (HOSPITAL_COMMUNITY)
Admission: RE | Admit: 2016-11-23 | Discharge: 2016-11-23 | Disposition: A | Discharge status: Home / Self Care | Payer: PRIVATE HEALTH INSURANCE | Source: Ambulatory Visit | Attending: Family Medicine | Admitting: Family Medicine

## 2016-11-23 DIAGNOSIS — M5440 Lumbago with sciatica, unspecified side: Secondary | ICD-10-CM | POA: Diagnosis not present

## 2016-11-23 DIAGNOSIS — M4316 Spondylolisthesis, lumbar region: Secondary | ICD-10-CM | POA: Insufficient documentation

## 2016-11-23 DIAGNOSIS — M5127 Other intervertebral disc displacement, lumbosacral region: Secondary | ICD-10-CM | POA: Insufficient documentation

## 2016-11-23 DIAGNOSIS — M545 Low back pain, unspecified: Secondary | ICD-10-CM

## 2016-11-23 DIAGNOSIS — M48061 Spinal stenosis, lumbar region without neurogenic claudication: Secondary | ICD-10-CM | POA: Diagnosis not present

## 2016-11-26 MED FILL — predniSONE 10 MG TABS: 10 | 12 days supply | Qty: 48 | Fill #0

## 2016-11-30 MED FILL — HYDROCHLOROTHIAZIDE 25 MG T: 25 | 90 days supply | Qty: 90 | Fill #0

## 2016-12-03 DIAGNOSIS — M4317 Spondylolisthesis, lumbosacral region: Secondary | ICD-10-CM | POA: Diagnosis not present

## 2016-12-11 MED FILL — HYDROCODON-APAP 10-325: 10-325 | 5 days supply | Qty: 30 | Fill #0

## 2016-12-25 MED FILL — CLOPIDOGREL 75 MG TABLET: 75 | 90 days supply | Qty: 90 | Fill #0

## 2016-12-25 MED FILL — AMLODIPINE BESYLATE 5 MG TA: 5 | 90 days supply | Qty: 90 | Fill #0

## 2016-12-29 MED FILL — traMADol HCL 50 MG TABS: 50 | 30 days supply | Qty: 60 | Fill #0

## 2016-12-29 MED FILL — GABAPENTIN 300 MG CAPSULE: 300 | 30 days supply | Qty: 90 | Fill #0

## 2017-01-05 ENCOUNTER — Ambulatory Visit: Payer: PRIVATE HEALTH INSURANCE | Attending: Orthopedic Surgery | Admitting: Physical Therapy

## 2017-01-05 ENCOUNTER — Encounter: Payer: Self-pay | Admitting: Physical Therapy

## 2017-01-05 DIAGNOSIS — M6281 Muscle weakness (generalized): Secondary | ICD-10-CM | POA: Insufficient documentation

## 2017-01-05 DIAGNOSIS — R2689 Other abnormalities of gait and mobility: Secondary | ICD-10-CM | POA: Diagnosis present

## 2017-01-05 DIAGNOSIS — M62830 Muscle spasm of back: Secondary | ICD-10-CM | POA: Diagnosis present

## 2017-01-05 DIAGNOSIS — G8929 Other chronic pain: Secondary | ICD-10-CM | POA: Insufficient documentation

## 2017-01-05 DIAGNOSIS — M5442 Lumbago with sciatica, left side: Secondary | ICD-10-CM | POA: Diagnosis present

## 2017-01-05 NOTE — Therapy (Signed)
[place], [place], [address] Phone: [phone]   Fax:  [phone]  Physical Therapy Evaluation  Patient Details  Name: Ralph Byrd MRN: [id number] Date of Birth: [birth year] Referring Provider: Frederik Pear MD  Encounter Date: 01/05/2017      PT End of Session - 01/05/17 0904    Visit Number 1   Number of Visits 12   Date for PT Re-Evaluation 02/16/17   Authorization Type WC   Authorization - Visit Number 1   Authorization - Number of Visits 12   PT Start Time 0906   PT Stop Time 0950   PT Time Calculation (min) 44 min   Activity Tolerance Patient tolerated treatment well;Patient limited by pain   Behavior During Therapy [facility] for tasks assessed/performed      Past Medical History:  Diagnosis Date  . Cancer [facility])    prostate cx, remission in 2012  . Hypertension     History reviewed. No pertinent surgical history.  There were no vitals filed for this visit.       Subjective Assessment - 01/05/17 0903    Subjective pt is a 61 y.o M with CC of low back pain that started 10/14/2016 that occured when he was holding a leg for OR prep at the hospital and he felt pain down both legs and with the L>R.  denies saddle  parestheisa or incontenince. pain goes down the LLe to the toes. Since onset the pain has improved a little since the medication, but getting out of bed is the worst.    Pertinent History hx or cx   Limitations Sitting;Standing;Walking;Other (comment)  getting out of bed.    How long can you sit comfortably? 20 min    How long can you stand comfortably? 5 -10 min   How long can you walk comfortably? 10-15 min   Diagnostic tests MRI and X-ray   Patient Stated Goals to make the pain go away, return to exercise,    Currently in Pain? Yes   Pain Score 6   at worst 8/10   Pain Location Back   Pain Orientation Left;Right;Lower   Pain Descriptors / Indicators Sharp;Aching;Sore   Pain  Radiating Towards LLE to the foot   Pain Onset More than a month ago   Pain Frequency Constant   Aggravating Factors  getting up the morning, getting up from chair, standing, bending forward, all trunk motions   Pain Relieving Factors laying down, resting, medication, kneeling, avoiding putting weight through the LLE   Effect of Pain on Daily Activities limited endurance/ strength in LLE,             [facility] PT Assessment - 01/05/17 0857      Assessment   Medical Diagnosis Low back pain with Radiculitis   Referring Provider Frederik Pear MD   Onset Date/Surgical Date --  10/14/2016   Hand Dominance Right   Next MD Visit 01/19/2017   Prior Therapy yes  2004     Precautions   Precautions Back   Precaution Comments sit down more often at work, avoiding lifting more than 10#,   hx of cx     Restrictions   Weight Bearing Restrictions No     Balance Screen   Has the patient fallen in the past 6 months No   Has the patient had a decrease in activity level because of a fear of falling?  No   Is the patient reluctant to  leave their home because of a fear of falling?  No     Home Environment   Living Environment Private residence   Living Arrangements Spouse/significant other   Available Help at Discharge Family;Available PRN/intermittently   Type of Home House   Home Access Stairs to enter   Entrance Stairs-Number of Steps 12   Entrance Stairs-Rails Right   Home Layout One level     Prior Function   Level of Independence Independent;Independent with basic ADLs   Vocation Full time employment  operating technician   Vocation Requirements lifting/ pushing/ pulling, holding, carrying,    Leisure riding bike, walking, watching sports     Cognition   Overall Cognitive Status Within Functional Limits for tasks assessed     Observation/Other Assessments   Focus on Therapeutic Outcomes (FOTO)  53% limited  predicted 41% limited     Posture/Postural Control   Posture/Postural  Control Postural limitations   Postural Limitations Rounded Shoulders;Forward head     ROM / Strength   AROM / PROM / Strength AROM;Strength     AROM   AROM Assessment Site Lumbar   Lumbar Flexion 55  pain and reproduction of LLE referred sx   Lumbar Extension 10  pinching symptoms at end range   Lumbar - Right Side Bend 10  end range pain   Lumbar - Left Side Bend 18     Strength   Strength Assessment Site Hip;Knee   Right/Left Hip Right;Left   Right Hip Flexion 4/5   Right Hip Extension 3+/5   Right Hip ABduction 4-/5   Right Hip ADduction 3+/5   Left Hip Flexion 3+/5   Left Hip Extension 3+/5   Left Hip ABduction 3/5   Left Hip ADduction 3+/5   Right/Left Knee Right;Left   Right Knee Flexion 4+/5   Right Knee Extension 4+/5   Left Knee Flexion 4-/5   Left Knee Extension 4-/5     Palpation   Spinal mobility hypomobility of L1-L5 PA Paivm with pain during assessment   Palpation comment TTP in the L lumbar paraspinals, tenderness L1-L5 and S1-S2 vertebrae     Special Tests    Special Tests Lumbar   Lumbar Tests Slump Test;Prone Knee Bend Test;Straight Leg Raise     Slump test   Findings Positive   Side Left     Straight Leg Raise   Findings Positive   Side  Left   Comment ipsilateral and contralateral SLR referral pain     Ambulation/Gait   Gait Pattern Step-through pattern;Decreased stride length;Trunk flexed;Decreased trunk rotation;Antalgic;Trendelenburg            Objective measurements completed on examination: See above findings.          [facility] PT Treatment/Exercise - 01/05/17 0857      Lumbar Exercises: Stretches   Prone on Elbows Stretch --  3 x 15    Prone on Elbows Stretch Limitations pillow underhips                 PT Education - 01/05/17 0949    Education provided Yes   Education Details evaluation findings, POC, goals, HEP with proper form/ rationale, anatomy of disc and biomechanics.    Person(s) Educated  Patient   Methods Explanation;Verbal cues;Demonstration  used spine model for demoonstration   Comprehension Verbalized understanding;Verbal cues required;Returned demonstration          PT Short Term Goals - 01/05/17 1000      PT SHORT TERM GOAL #  1   Title pt to be I with inital HEP (01/26/2017)   Time 3   Period Weeks   Status New     PT SHORT TERM GOAL #2   Title pt to verbalize/ demo proper posture/ lifting and carrying mechanics to prevent and reduce low back (01/26/2017)   Time 3   Period Weeks   Status New     PT SHORT TERM GOAL #3   Title pt to reduce low back muscle tightness and guarding to decrease pain to </= 4/10 and promote trunk mobility for therapuetic progression (01/26/2017)   Time 3   Period Weeks   Status New     PT SHORT TERM GOAL #4   Title increase strength in LLE hip extensors/ abductors to >/= 4-/5 to promote hip stability / safety (01/26/2017)   Time 3   Period Weeks   Status New           PT Long Term Goals - 01/05/17 1003      PT LONG TERM GOAL #1   Title pt to improve trunk flexion to >/= 70 degrees and extension/ R side bending by >/= 8 degrees with </= 1/10 pain and no LLE referral for functional trunk mobility (02/16/17)   Time 6   Period Weeks   Status New     PT LONG TERM GOAL #2   Title pt to increase LLE strength to >/= 4+/5 for safety with prolonged walking/ standing activities (02/16/17)   Time 6   Period Weeks   Status New     PT LONG TERM GOAL #3   Title pt to be able to sit, stand/ walk >/= 45 min with </= 1/10 pain and no referral symptoms for functional endurance rquired for work related tasks (02/16/2017)   Time 6   Period Weeks   Status New     PT LONG TERM GOAL #4   Title increase FOTO score to </= 41% limited to demo improvement in function (02/16/17)   Time 6   Period Weeks   Status New     PT LONG TERM GOAL #5   Title pt to be I with all HEP given as of last visit (02/16/17)   Time 6   Period Weeks   Status New                 Plan - 01/05/17 5852    Clinical Impression Statement Mr. Mckelvie presents to OPPT with CC or low back pain with LLE referral to the foot starting 10/14/2016 from non-traumatic injury of holding a pt's leg in the OR and felt pain inthe low back and LLE. He exhibits significant guarding and pain limited all trunk mobility with pain noted in all directions. weakness in the LLE compared bil with pain during testing. Special testing and pt presentation suggest high probability of discogenic pathology.  following prone on elbow pto reported centralizations and improvement in pain. He would benefit from physical therapy to reduce pain, reduce muslce spasm/ improve trunk mobility, increase strength and return pt to PLOF by addressing the deficits listed.    Clinical Presentation Stable   Clinical Decision Making Low   Rehab Potential Good   PT Frequency 2x / week   PT Duration 6 weeks   PT Treatment/Interventions ADLs/Self Care Home Management;Therapeutic activities;Therapeutic exercise;Moist Heat;Traction;Cryotherapy;Electrical Stimulation;Iontophoresis 4mg /ml Dexamethasone;Dry needling;Taping;Manual techniques;Passive range of motion;Patient/family education;Neuromuscular re-education;Gait training;Ultrasound   PT Next Visit Plan assess/ review HEP and  update PRN, extension biased progress  from prone on elbows PRN, core work, trial traction, soft tissue work   PT Home Exercise Plan prone on elbows. pelvic tilt, decompression postion, single knee to chest   Consulted and Agree with Plan of Care Patient      Patient will benefit from skilled therapeutic intervention in order to improve the following deficits and impairments:  Abnormal gait, Pain, Improper body mechanics, Postural dysfunction, Decreased endurance, Decreased activity tolerance, Decreased balance, Decreased strength, Increased fascial restricitons  Visit Diagnosis: Chronic left-sided low back pain with left-sided  sciatica - Plan: PT plan of care cert/re-cert  Muscle spasm of back - Plan: PT plan of care cert/re-cert  Muscle weakness (generalized) - Plan: PT plan of care cert/re-cert  Other abnormalities of gait and mobility - Plan: PT plan of care cert/re-cert     Problem List There are no active problems to display for this patient.  [name] PT, DPT, LAT, ATC  01/05/17  10:09 AM      [facility] [address] [place], [place], [address] Phone: [phone]   Fax:  [phone]  Name: RENZO VINCELETTE MRN: [id number] Date of Birth: Jan 02, 1956

## 2017-01-12 ENCOUNTER — Encounter: Payer: Self-pay | Admitting: Physical Therapy

## 2017-01-12 ENCOUNTER — Ambulatory Visit: Payer: PRIVATE HEALTH INSURANCE | Attending: Family Medicine | Admitting: Physical Therapy

## 2017-01-12 DIAGNOSIS — G8929 Other chronic pain: Secondary | ICD-10-CM | POA: Diagnosis present

## 2017-01-12 DIAGNOSIS — M5442 Lumbago with sciatica, left side: Secondary | ICD-10-CM | POA: Insufficient documentation

## 2017-01-12 DIAGNOSIS — M62830 Muscle spasm of back: Secondary | ICD-10-CM

## 2017-01-12 DIAGNOSIS — R2689 Other abnormalities of gait and mobility: Secondary | ICD-10-CM | POA: Diagnosis present

## 2017-01-12 DIAGNOSIS — M6281 Muscle weakness (generalized): Secondary | ICD-10-CM

## 2017-01-12 NOTE — Therapy (Signed)
[place], [place], [address] Phone: [phone]   Fax:  [phone]  Physical Therapy Treatment  Patient Details  Name: Ralph Byrd MRN: [id number] Date of Birth: Nov 06, 1955 Referring Provider: Frederik Pear MD  Encounter Date: 01/12/2017      PT End of Session - 01/12/17 1117    Visit Number 2   Number of Visits 12   Date for PT Re-Evaluation 02/16/17   Authorization - Visit Number 2   Authorization - Number of Visits 12   PT Start Time 0731   PT Stop Time 0818   PT Time Calculation (min) 47 min   Activity Tolerance Patient tolerated treatment well   Behavior During Therapy [facility] for tasks assessed/performed      Past Medical History:  Diagnosis Date  . Cancer [facility])    prostate cx, remission in 2012  . Hypertension     History reviewed. No pertinent surgical history.  There were no vitals filed for this visit.      Subjective Assessment - 01/12/17 0749    Subjective Exercises help the pain some.  more into back. .  has a question about decompression.  Able to bear a little more weight through leg.   Currently in Pain? Yes   Pain Score 6    Pain Location Back   Pain Orientation Right;Left;Lower   Pain Descriptors / Indicators Aching;Sore;Sharp;Shooting   Pain Type Acute pain   Pain Radiating Towards into foot   Pain Frequency Constant   Aggravating Factors  Moving, sit to stand. moving, rolling   Pain Relieving Factors laying down resting,  meds    Effect of Pain on Daily Activities limited AFDL, sleeping                         [facility] Adult PT Treatment/Exercise - 01/12/17 0001      Lumbar Exercises: Stretches   Pelvic Tilt 5 reps   Prone on Elbows Stretch --  15 X seconds   Press Ups Limitations 15 reps, able to centralize     Lumbar Exercises: Standing   Other Standing Lumbar Exercises McKenzie lateral shifts 3 X able to centralize with increased LBP     Lumbar Exercises: Supine   Other Supine Lumbar Exercises DECOMPRESSION, cues     Lumbar Exercises: Sidelying   Other Sidelying Lumbar Exercises Mckenzie lateral shifting on side over pillow at waist.  able to decrease pain .  tolerated this better than standing.      Modalities   Modalities Electrical Stimulation;Moist Heat     Moist Heat Therapy   Number Minutes Moist Heat 15 Minutes   Moist Heat Location Lumbar Spine  and QL     Electrical Stimulation   Electrical Stimulation Location lumbar and gluteal   Electrical Stimulation Action IFC   Electrical Stimulation Parameters 7   Electrical Stimulation Goals Pain     Manual Therapy   Manual Therapy Soft tissue mobilization  QL, paraspinals light tolerated.   Manual therapy comments strumming illiopsoas both in decompression position.  tissue softened, pulse felt after muscles relaxed                PT Education - 01/12/17 1116    Education provided Yes   Education Details Sleeping position pillow between knees,  May want a pillow at waist on side   Person(s) Educated Patient   Methods Explanation;Demonstration;Verbal cues   Comprehension Verbalized understanding;Returned demonstration  PT Short Term Goals - 01/05/17 1000      PT SHORT TERM GOAL #1   Title pt to be I with inital HEP (01/26/2017)   Time 3   Period Weeks   Status New     PT SHORT TERM GOAL #2   Title pt to verbalize/ demo proper posture/ lifting and carrying mechanics to prevent and reduce low back (01/26/2017)   Time 3   Period Weeks   Status New     PT SHORT TERM GOAL #3   Title pt to reduce low back muscle tightness and guarding to decrease pain to </= 4/10 and promote trunk mobility for therapuetic progression (01/26/2017)   Time 3   Period Weeks   Status New     PT SHORT TERM GOAL #4   Title increase strength in LLE hip extensors/ abductors to >/= 4-/5 to promote hip stability / safety (01/26/2017)   Time 3   Period Weeks    Status New           PT Long Term Goals - 01/05/17 1003      PT LONG TERM GOAL #1   Title pt to improve trunk flexion to >/= 70 degrees and extension/ R side bending by >/= 8 degrees with </= 1/10 pain and no LLE referral for functional trunk mobility (02/16/17)   Time 6   Period Weeks   Status New     PT LONG TERM GOAL #2   Title pt to increase LLE strength to >/= 4+/5 for safety with prolonged walking/ standing activities (02/16/17)   Time 6   Period Weeks   Status New     PT LONG TERM GOAL #3   Title pt to be able to sit, stand/ walk >/= 45 min with </= 1/10 pain and no referral symptoms for functional endurance rquired for work related tasks (02/16/2017)   Time 6   Period Weeks   Status New     PT LONG TERM GOAL #4   Title increase FOTO score to </= 41% limited to demo improvement in function (02/16/17)   Time 6   Period Weeks   Status New     PT LONG TERM GOAL #5   Title pt to be I with all HEP given as of last visit (02/16/17)   Time 6   Period Weeks   Status New               Plan - 01/12/17 1117    Clinical Impression Statement Pain centralized post session.  Pain 3.4/10.  No leg pain.  Lateral shift continued to be present.  No pain prior to getting off bed , in sidelying, after IFC/Heat.   PT Treatment/Interventions ADLs/Self Care Home Management;Therapeutic activities;Therapeutic exercise;Moist Heat;Traction;Cryotherapy;Electrical Stimulation;Iontophoresis 4mg /ml Dexamethasone;Dry needling;Taping;Manual techniques;Passive range of motion;Patient/family education;Neuromuscular re-education;Gait training;Ultrasound   PT Next Visit Plan assess/ review HEP and  update PRN, Try to get to decrease lateral shift  extension biased progress from prone on elbows PRN, core work, trial traction, soft tissue work   PT Home Exercise Plan prone on elbows. pelvic tilt, decompression postion, single knee to chest   Consulted and Agree with Plan of Care Patient      Patient  will benefit from skilled therapeutic intervention in order to improve the following deficits and impairments:  Abnormal gait, Pain, Improper body mechanics, Postural dysfunction, Decreased endurance, Decreased activity tolerance, Decreased balance, Decreased strength, Increased fascial restricitons  Visit Diagnosis: Chronic left-sided low back pain  with left-sided sciatica  Muscle spasm of back  Muscle weakness (generalized)  Other abnormalities of gait and mobility     Problem List There are no active problems to display for this patient.   HARRIS,KAREN PTA 01/12/2017, 11:21 AM  [facility] [address] [place], [place], [address] Phone: [phone]   Fax:  [phone]  Name: Ralph Byrd MRN: [id number] Date of Birth: 1955-12-05

## 2017-01-15 MED FILL — PRAVASTATIN SODIUM 20 MG TA: 20 | 90 days supply | Qty: 90 | Fill #1

## 2017-01-18 ENCOUNTER — Encounter: Payer: Self-pay | Admitting: Physical Therapy

## 2017-01-18 ENCOUNTER — Ambulatory Visit: Payer: PRIVATE HEALTH INSURANCE | Admitting: Physical Therapy

## 2017-01-18 DIAGNOSIS — M5442 Lumbago with sciatica, left side: Secondary | ICD-10-CM | POA: Diagnosis not present

## 2017-01-18 DIAGNOSIS — R2689 Other abnormalities of gait and mobility: Secondary | ICD-10-CM

## 2017-01-18 DIAGNOSIS — M6281 Muscle weakness (generalized): Secondary | ICD-10-CM

## 2017-01-18 DIAGNOSIS — G8929 Other chronic pain: Secondary | ICD-10-CM

## 2017-01-18 DIAGNOSIS — M62830 Muscle spasm of back: Secondary | ICD-10-CM

## 2017-01-18 NOTE — Therapy (Signed)
[place], [place], [address] Phone: [phone]   Fax:  [phone]  Physical Therapy Treatment  Patient Details  Name: Ralph Byrd MRN: [id number] Date of Birth: 06/19/1956 Referring Provider: Frederik Pear MD  Encounter Date: 01/18/2017      PT End of Session - 01/18/17 0917    Visit Number 3   Number of Visits 12   Date for PT Re-Evaluation 02/16/17   PT Start Time 0918   PT Stop Time 1015   PT Time Calculation (min) 57 min   Activity Tolerance Patient tolerated treatment well   Behavior During Therapy [facility] for tasks assessed/performed      Past Medical History:  Diagnosis Date  . Cancer [facility])    prostate cx, remission in 2012  . Hypertension     History reviewed. No pertinent surgical history.  There were no vitals filed for this visit.      Subjective Assessment - 01/18/17 0917    Subjective "things are going okay, 2 days ago I tried to walk and I was a little sore"    Currently in Pain? Yes   Pain Score 3    Pain Location Back   Pain Orientation Left;Right   Pain Descriptors / Indicators Sore;Sharp   Pain Type Chronic pain   Pain Radiating Towards to bil hips   Pain Onset More than a month ago   Pain Frequency Intermittent                         [facility] Adult PT Treatment/Exercise - 01/18/17 0925      Lumbar Exercises: Stretches   Single Knee to Chest Stretch 2 reps;30 seconds  performed bil   Lower Trunk Rotation 5 reps;30 seconds   Prone on Elbows Stretch 5 reps;30 seconds   Press Ups --  2 x 10      Lumbar Exercises: Aerobic   Stationary Bike Nu-step L5 x 5 min   UE/LE only     Modalities   Modalities Traction     Traction   Type of Traction Lumbar   Min (lbs) 60   Max (lbs) 90   Hold Time 60   Rest Time 60   Time 10     Manual Therapy   Manual Therapy Myofascial release;Soft tissue mobilization   Manual therapy comments manual trigger  point release over bil lumbar paraspinals x 2 ea.   Soft tissue mobilization DTM over bil lumbar paraspinals   Myofascial Release fascial stretching/ rolling over bil lumbar paraspinals                PT Education - 01/18/17 1000    Education provided Yes   Education Details what mechanical traction is and benefits, reviewed exercises and progress HEP to prone press-ups   Person(s) Educated Patient   Methods Explanation;Verbal cues;Handout   Comprehension Verbalized understanding;Verbal cues required          PT Short Term Goals - 01/05/17 1000      PT SHORT TERM GOAL #1   Title pt to be I with inital HEP (01/26/2017)   Time 3   Period Weeks   Status New     PT SHORT TERM GOAL #2   Title pt to verbalize/ demo proper posture/ lifting and carrying mechanics to prevent and reduce low back (01/26/2017)   Time 3   Period Weeks   Status New     PT  SHORT TERM GOAL #3   Title pt to reduce low back muscle tightness and guarding to decrease pain to </= 4/10 and promote trunk mobility for therapuetic progression (01/26/2017)   Time 3   Period Weeks   Status New     PT SHORT TERM GOAL #4   Title increase strength in LLE hip extensors/ abductors to >/= 4-/5 to promote hip stability / safety (01/26/2017)   Time 3   Period Weeks   Status New           PT Long Term Goals - 01/05/17 1003      PT LONG TERM GOAL #1   Title pt to improve trunk flexion to >/= 70 degrees and extension/ R side bending by >/= 8 degrees with </= 1/10 pain and no LLE referral for functional trunk mobility (02/16/17)   Time 6   Period Weeks   Status New     PT LONG TERM GOAL #2   Title pt to increase LLE strength to >/= 4+/5 for safety with prolonged walking/ standing activities (02/16/17)   Time 6   Period Weeks   Status New     PT LONG TERM GOAL #3   Title pt to be able to sit, stand/ walk >/= 45 min with </= 1/10 pain and no referral symptoms for functional endurance rquired for work related  tasks (02/16/2017)   Time 6   Period Weeks   Status New     PT LONG TERM GOAL #4   Title increase FOTO score to </= 41% limited to demo improvement in function (02/16/17)   Time 6   Period Weeks   Status New     PT LONG TERM GOAL #5   Title pt to be I with all HEP given as of last visit (02/16/17)   Time 6   Period Weeks   Status New               Plan - 01/18/17 1001    Clinical Impression Statement pt continues to report improvement in pain at 3/10 today. continued prone work with progressison to press-ups which he performed well. following manual trigger point release he reported relief of tension inthe low back. trialed mechanical traction which he reported relief of pain and tension.    PT Next Visit Plan how was traction,  Try to get to decrease lateral shift PRN,  progress extension PRN, core work, trial traction, soft tissue work, modalities PRN   PT Home Exercise Plan prone on elbows. pelvic tilt, decompression postion, single knee to chest, prone press-up   Consulted and Agree with Plan of Care Patient      Patient will benefit from skilled therapeutic intervention in order to improve the following deficits and impairments:  Abnormal gait, Pain, Improper body mechanics, Postural dysfunction, Decreased endurance, Decreased activity tolerance, Decreased balance, Decreased strength, Increased fascial restricitons  Visit Diagnosis: Chronic left-sided low back pain with left-sided sciatica  Muscle spasm of back  Muscle weakness (generalized)  Other abnormalities of gait and mobility     Problem List There are no active problems to display for this patient.  [name] PT, DPT, LAT, ATC  01/18/17  10:06 AM      [facility] [address] [place], [place], [address] Phone: [phone]   Fax:  [phone]  Name: Ralph Byrd MRN: [id number] Date of Birth: 1956-04-10

## 2017-01-20 ENCOUNTER — Encounter: Payer: Self-pay | Admitting: Physical Therapy

## 2017-01-20 ENCOUNTER — Ambulatory Visit: Payer: PRIVATE HEALTH INSURANCE | Attending: Family Medicine | Admitting: Physical Therapy

## 2017-01-20 DIAGNOSIS — M6281 Muscle weakness (generalized): Secondary | ICD-10-CM | POA: Diagnosis present

## 2017-01-20 DIAGNOSIS — M62830 Muscle spasm of back: Secondary | ICD-10-CM | POA: Diagnosis present

## 2017-01-20 DIAGNOSIS — R2689 Other abnormalities of gait and mobility: Secondary | ICD-10-CM | POA: Diagnosis present

## 2017-01-20 DIAGNOSIS — M5442 Lumbago with sciatica, left side: Secondary | ICD-10-CM | POA: Diagnosis not present

## 2017-01-20 DIAGNOSIS — G8929 Other chronic pain: Secondary | ICD-10-CM | POA: Diagnosis present

## 2017-01-20 NOTE — Therapy (Signed)
[place], [place], [address] Phone: [phone]   Fax:  [phone]  Physical Therapy Treatment  Patient Details  Name: Ralph Byrd MRN: [id number] Date of Birth: [birth year] Referring Provider: Frederik Pear MD  Encounter Date: 01/20/2017      PT End of Session - 01/20/17 0932    Visit Number 4   Number of Visits 12   Date for PT Re-Evaluation 02/16/17   Authorization - Visit Number 4   Authorization - Number of Visits 12   PT Start Time 0932   PT Stop Time 1023   PT Time Calculation (min) 51 min   Activity Tolerance Patient tolerated treatment well   Behavior During Therapy [facility] for tasks assessed/performed      Past Medical History:  Diagnosis Date  . Cancer [facility])    prostate cx, remission in 2012  . Hypertension     History reviewed. No pertinent surgical history.  There were no vitals filed for this visit.      Subjective Assessment - 01/20/17 0931    Subjective "the traction made me sore the other day but I am feeling better today. pain doesn't radiate any farther than my hip"   Currently in Pain? Yes   Pain Score 3    Pain Location Back   Pain Orientation Left   Pain Type Chronic pain   Pain Frequency Intermittent   Aggravating Factors  laying down and rolling to the L.    Pain Relieving Factors stretching,                          [facility] Adult PT Treatment/Exercise - 01/20/17 0946      Lumbar Exercises: Aerobic   Stationary Bike Nu-step L5 x 5 min      Lumbar Exercises: Supine   Bridge 10 reps  x 2 sets     Lumbar Exercises: Prone   Other Prone Lumbar Exercises prone press up with sag 2 x 10      Moist Heat Therapy   Number Minutes Moist Heat 10 Minutes   Moist Heat Location Lumbar Spine  with pt in prone     Manual Therapy   Manual Therapy Joint mobilization   Manual therapy comments tack and stretch of the L piriformis   Joint Mobilization  grade 3 L1-L5 PA mobs   Soft tissue mobilization IASTM over the L lumbar paraspinals   Myofascial Release fascial stretching/ rolling over L lumbar paraspinals          Trigger Point Dry Needling - 01/20/17 0944    Consent Given? Yes   Education Handout Provided Yes   Muscles Treated Upper Body Longissimus   Muscles Treated Lower Body Piriformis   Longissimus Response Twitch response elicited;Palpable increased muscle length  L5-L3 on L lumbar paraspinals   Piriformis Response Twitch response elicited;Palpable increased muscle length  L side only              PT Education - 01/20/17 0945    Education provided Yes   Education Details anatomy of muscle and referral patterns. what DN is, benefits/ what to expect, and after care.    Person(s) Educated Patient   Methods Explanation;Verbal cues;Handout   Comprehension Verbalized understanding;Verbal cues required          PT Short Term Goals - 01/05/17 1000      PT SHORT TERM GOAL #1   Title pt to be  I with inital HEP (01/26/2017)   Time 3   Period Weeks   Status New     PT SHORT TERM GOAL #2   Title pt to verbalize/ demo proper posture/ lifting and carrying mechanics to prevent and reduce low back (01/26/2017)   Time 3   Period Weeks   Status New     PT SHORT TERM GOAL #3   Title pt to reduce low back muscle tightness and guarding to decrease pain to </= 4/10 and promote trunk mobility for therapuetic progression (01/26/2017)   Time 3   Period Weeks   Status New     PT SHORT TERM GOAL #4   Title increase strength in LLE hip extensors/ abductors to >/= 4-/5 to promote hip stability / safety (01/26/2017)   Time 3   Period Weeks   Status New           PT Long Term Goals - 01/05/17 1003      PT LONG TERM GOAL #1   Title pt to improve trunk flexion to >/= 70 degrees and extension/ R side bending by >/= 8 degrees with </= 1/10 pain and no LLE referral for functional trunk mobility (02/16/17)   Time 6   Period  Weeks   Status New     PT LONG TERM GOAL #2   Title pt to increase LLE strength to >/= 4+/5 for safety with prolonged walking/ standing activities (02/16/17)   Time 6   Period Weeks   Status New     PT LONG TERM GOAL #3   Title pt to be able to sit, stand/ walk >/= 45 min with </= 1/10 pain and no referral symptoms for functional endurance rquired for work related tasks (02/16/2017)   Time 6   Period Weeks   Status New     PT LONG TERM GOAL #4   Title increase FOTO score to </= 41% limited to demo improvement in function (02/16/17)   Time 6   Period Weeks   Status New     PT LONG TERM GOAL #5   Title pt to be I with all HEP given as of last visit (02/16/17)   Time 6   Period Weeks   Status New               Plan - 01/20/17 0947    Clinical Impression Statement pt continues to report 3/10 pain in the low back with referral only to the L proximal lateral hip. Educated and performed DN over the L lumbar paraspinals and piriformis. soft tissue work and mobs over the lumbar spine which pt reported relief of pain/ tightness. pt was able to do all exercises to day reporting fatigue but relief of tightness in the low back. conitnued MHP post session for soreness    PT Next Visit Plan assess response to DN, Posture education,  Try to get to decrease lateral shift PRN,  progress extension PRN, core work, traction PRN, soft tissue work, modalities PRN   PT Home Exercise Plan prone on elbows. pelvic tilt, decompression postion, single knee to chest, prone press-up   Consulted and Agree with Plan of Care Patient      Patient will benefit from skilled therapeutic intervention in order to improve the following deficits and impairments:  Abnormal gait, Pain, Improper body mechanics, Postural dysfunction, Decreased endurance, Decreased activity tolerance, Decreased balance, Decreased strength, Increased fascial restricitons  Visit Diagnosis: Chronic left-sided low back pain with left-sided  sciatica  Muscle spasm of back  Muscle weakness (generalized)  Other abnormalities of gait and mobility     Problem List There are no active problems to display for this patient.  [name] PT, DPT, LAT, ATC  01/20/17  10:17 AM      [facility] [address] [place], [place], [address] Phone: [phone]   Fax:  [phone]  Name: Ralph Byrd MRN: [id number] Date of Birth: 1955/11/08

## 2017-01-25 ENCOUNTER — Ambulatory Visit: Payer: PRIVATE HEALTH INSURANCE | Admitting: Physical Therapy

## 2017-01-25 ENCOUNTER — Encounter: Payer: Self-pay | Admitting: Physical Therapy

## 2017-01-25 DIAGNOSIS — M5442 Lumbago with sciatica, left side: Secondary | ICD-10-CM | POA: Diagnosis not present

## 2017-01-25 DIAGNOSIS — M62830 Muscle spasm of back: Secondary | ICD-10-CM

## 2017-01-25 DIAGNOSIS — R2689 Other abnormalities of gait and mobility: Secondary | ICD-10-CM | POA: Diagnosis not present

## 2017-01-25 DIAGNOSIS — G8929 Other chronic pain: Secondary | ICD-10-CM | POA: Diagnosis not present

## 2017-01-25 DIAGNOSIS — M6281 Muscle weakness (generalized): Secondary | ICD-10-CM

## 2017-01-25 NOTE — Therapy (Signed)
[place], [place], [address] Phone: [phone]   Fax:  [phone]  Physical Therapy Treatment  Patient Details  Name: Ralph Byrd MRN: [id number] Date of Birth: 1955/08/02 Referring Provider: Frederik Pear MD  Encounter Date: 01/25/2017      PT End of Session - 01/25/17 0923    Visit Number 5   Number of Visits 12   Date for PT Re-Evaluation 02/16/17   Authorization - Visit Number 5   Authorization - Number of Visits 12   PT Start Time 0925   PT Stop Time 1011   PT Time Calculation (min) 46 min   Activity Tolerance Patient tolerated treatment well   Behavior During Therapy [facility] for tasks assessed/performed      Past Medical History:  Diagnosis Date  . Cancer [facility])    prostate cx, remission in 2012  . Hypertension     History reviewed. No pertinent surgical history.  There were no vitals filed for this visit.      Subjective Assessment - 01/25/17 0923    Subjective "I am feeling sore since the last session, reports improvement in mobility and only N/T in the foot on the L.    Currently in Pain? Yes   Pain Score 2    Pain Location Back   Pain Orientation Left   Pain Descriptors / Indicators Aching;Sore   Pain Type Chronic pain   Pain Onset More than a month ago   Pain Frequency Intermittent   Aggravating Factors  N/A   Pain Relieving Factors exercising/ stretching.                          [facility] Adult PT Treatment/Exercise - 01/25/17 0930      Self-Care   Self-Care Posture   Posture proper posture/ lifting/ carrying mechanics.      Lumbar Exercises: Stretches   Single Knee to Chest Stretch 2 reps;30 seconds   Lower Trunk Rotation 5 reps;30 seconds   Piriformis Stretch 2 reps;30 seconds     Lumbar Exercises: Aerobic   Stationary Bike Nu-step L5 x 6 min U/LE     Lumbar Exercises: Standing   Other Standing Lumbar Exercises standing repeated extension 3 x 15  with hands on hips.      Manual Therapy   Manual therapy comments manual trigger point release x 2 in bil thoracolumbar paraspinals  educated how to perform at home                PT Education - 01/25/17 0940    Education provided Yes   Education Details manual trigger point release using tennis ball against the wall. soreness that is related to exercise, it doesn't mean it is causing damage it is only that the muscles aren't used to working.  posture education   Person(s) Educated Patient   Methods Explanation;Verbal cues;Demonstration   Comprehension Verbalized understanding;Verbal cues required;Returned demonstration          PT Short Term Goals - 01/25/17 1013      PT SHORT TERM GOAL #1   Title pt to be I with inital HEP (01/26/2017)   Time 3   Period Weeks   Status Achieved     PT SHORT TERM GOAL #2   Title pt to verbalize/ demo proper posture/ lifting and carrying mechanics to prevent and reduce low back (01/26/2017)   Time 3   Period Weeks   Status On-going  PT SHORT TERM GOAL #3   Title pt to reduce low back muscle tightness and guarding to decrease pain to </= 4/10 and promote trunk mobility for therapuetic progression (01/26/2017)   Time 3   Period Weeks   Status Achieved     PT SHORT TERM GOAL #4   Title increase strength in LLE hip extensors/ abductors to >/= 4-/5 to promote hip stability / safety (01/26/2017)   Time 3   Period Weeks   Status Unable to assess           PT Long Term Goals - 01/05/17 1003      PT LONG TERM GOAL #1   Title pt to improve trunk flexion to >/= 70 degrees and extension/ R side bending by >/= 8 degrees with </= 1/10 pain and no LLE referral for functional trunk mobility (02/16/17)   Time 6   Period Weeks   Status New     PT LONG TERM GOAL #2   Title pt to increase LLE strength to >/= 4+/5 for safety with prolonged walking/ standing activities (02/16/17)   Time 6   Period Weeks   Status New     PT LONG TERM GOAL  #3   Title pt to be able to sit, stand/ walk >/= 45 min with </= 1/10 pain and no referral symptoms for functional endurance rquired for work related tasks (02/16/2017)   Time 6   Period Weeks   Status New     PT LONG TERM GOAL #4   Title increase FOTO score to </= 41% limited to demo improvement in function (02/16/17)   Time 6   Period Weeks   Status New     PT LONG TERM GOAL #5   Title pt to be I with all HEP given as of last visit (02/16/17)   Time 6   Period Weeks   Status New               Plan - 01/25/17 0945    Clinical Impression Statement reported decreaesd pain today at 2/10 despite soreness following last session that was likely due to post exercise soreness. educated about manual trigger point release techniques which assisted with centralization of pain. educated about posture and lifting mechanics. progress extension based program to standing. pt reported continued to report 2/10 pain but no increase during or following session.    PT Next Visit Plan Try to get to decrease lateral shift PRN,  progress extension PRN, core work, traction PRN, soft tissue work, modalities PRN   PT Home Exercise Plan prone on elbows. pelvic tilt, decompression postion, single knee to chest, prone press-up, standing repeated extension, piriformis stretching, posture education, manual trigger point release   Consulted and Agree with Plan of Care Patient      Patient will benefit from skilled therapeutic intervention in order to improve the following deficits and impairments:     Visit Diagnosis: Chronic left-sided low back pain with left-sided sciatica  Muscle spasm of back  Muscle weakness (generalized)  Other abnormalities of gait and mobility     Problem List There are no active problems to display for this patient.  [name] PT, DPT, LAT, ATC  01/25/17  10:14 AM      [facility] [address] [place],  [place], [address] Phone: [phone]   Fax:  [phone]  Name: Ralph Byrd MRN: [id number] Date of Birth: 02-22-1956

## 2017-01-25 NOTE — Patient Instructions (Addendum)

## 2017-01-27 ENCOUNTER — Ambulatory Visit: Payer: PRIVATE HEALTH INSURANCE | Admitting: Physical Therapy

## 2017-01-27 ENCOUNTER — Encounter: Payer: Self-pay | Admitting: Physical Therapy

## 2017-01-27 DIAGNOSIS — M5442 Lumbago with sciatica, left side: Secondary | ICD-10-CM | POA: Diagnosis not present

## 2017-01-27 DIAGNOSIS — M6281 Muscle weakness (generalized): Secondary | ICD-10-CM | POA: Diagnosis not present

## 2017-01-27 DIAGNOSIS — M62830 Muscle spasm of back: Secondary | ICD-10-CM | POA: Diagnosis not present

## 2017-01-27 DIAGNOSIS — G8929 Other chronic pain: Secondary | ICD-10-CM

## 2017-01-27 DIAGNOSIS — R2689 Other abnormalities of gait and mobility: Secondary | ICD-10-CM

## 2017-01-27 NOTE — Patient Instructions (Signed)
Achilles / Soleus, Standing    Stand, right foot behind, heel on floor and turned slightly out. Lower hips and bend knees. Hold _30__ seconds. Repeat _3__ times per session. Do _1__ sessions per day.     May use step to stretch both at the same time Copyright  [employer]. All rights reserved.

## 2017-01-27 NOTE — Therapy (Signed)
[place], [place], [address] Phone: [phone]   Fax:  [phone]  Physical Therapy Treatment  Patient Details  Name: Ralph Byrd MRN: [id number] Date of Birth: [birth year] Referring Provider: Frederik Pear MD  Encounter Date: 01/27/2017      PT End of Session - 01/27/17 1229    Visit Number 6   Number of Visits 12   Date for PT Re-Evaluation 02/16/17   Authorization - Visit Number 6   Authorization - Number of Visits 12   PT Start Time 0930   PT Stop Time 1030   PT Time Calculation (min) 60 min   Activity Tolerance Patient tolerated treatment well   Behavior During Therapy [facility] for tasks assessed/performed      Past Medical History:  Diagnosis Date  . Cancer [facility])    prostate cx, remission in 2012  . Hypertension     History reviewed. No pertinent surgical history.  There were no vitals filed for this visit.      Subjective Assessment - 01/27/17 0932    Subjective No pain except for Great toe left foot   Currently in Pain? Yes   Pain Location Back  Pain only in great toe   Pain Orientation Left   Pain Descriptors / Indicators --  hard to describe.   Pain Radiating Towards Left great toe pain only,     Pain Frequency Constant   Aggravating Factors  walking he notices it more   Pain Relieving Factors exercise stretches   Multiple Pain Sites No                         [facility] Adult PT Treatment/Exercise - 01/27/17 0001      Therapeutic Activites    Therapeutic Activities Lifting   Lifting ball from mat,  noted tightness in soleus left with this,  correct technique     Lumbar Exercises: Stretches   Passive Hamstring Stretch 3 reps;30 seconds  both,  length gradually improved with reps   Lower Trunk Rotation 2 reps;20 seconds   Lower Trunk Rotation Limitations increased thigh pain so stopped   Standing Extension 5 reps   Prone on Elbows Stretch 1 rep;10 seconds    Piriformis Stretch 2 reps;30 seconds  Prone hip rotation within pain free range     Lumbar Exercises: Aerobic   Stationary Bike Nu-step L5 x 6 min U/LE     Lumbar Exercises: Standing   Wall Slides 5 reps;5 seconds     Lumbar Exercises: Supine   Straight Leg Raise 20 reps  left     Lumbar Exercises: Prone   Other Prone Lumbar Exercises Multifitus press,  centralized pain out of great toe initially then groin pain and upper thigh pain returned  even with 2 pillows , so stopped.     Knee/Hip Exercises: Stretches   Gastroc Stretch 1 rep;30 seconds   Gastroc Stretch Limitations incline   Soleus Stretch 3 reps;30 seconds   Soleus Stretch Limitations Lt stretches more than right,  HEP     Moist Heat Therapy   Number Minutes Moist Heat 15 Minutes   Moist Heat Location --  left calf     Manual Therapy   Soft tissue mobilization instrument assist paraspinals LB and rhoracic                PT Education - 01/27/17 1015    Education provided Yes   Education Details  Lifting, soelus stretch   Person(s) Educated Patient   Methods Explanation;Demonstration;Verbal cues;Handout   Comprehension Verbalized understanding;Returned demonstration          PT Short Term Goals - 01/25/17 1013      PT SHORT TERM GOAL #1   Title pt to be I with inital HEP (01/26/2017)   Time 3   Period Weeks   Status Achieved     PT SHORT TERM GOAL #2   Title pt to verbalize/ demo proper posture/ lifting and carrying mechanics to prevent and reduce low back (01/26/2017)   Time 3   Period Weeks   Status On-going     PT SHORT TERM GOAL #3   Title pt to reduce low back muscle tightness and guarding to decrease pain to </= 4/10 and promote trunk mobility for therapuetic progression (01/26/2017)   Time 3   Period Weeks   Status Achieved     PT SHORT TERM GOAL #4   Title increase strength in LLE hip extensors/ abductors to >/= 4-/5 to promote hip stability / safety (01/26/2017)   Time 3   Period  Weeks   Status Unable to assess           PT Long Term Goals - 01/05/17 1003      PT LONG TERM GOAL #1   Title pt to improve trunk flexion to >/= 70 degrees and extension/ R side bending by >/= 8 degrees with </= 1/10 pain and no LLE referral for functional trunk mobility (02/16/17)   Time 6   Period Weeks   Status New     PT LONG TERM GOAL #2   Title pt to increase LLE strength to >/= 4+/5 for safety with prolonged walking/ standing activities (02/16/17)   Time 6   Period Weeks   Status New     PT LONG TERM GOAL #3   Title pt to be able to sit, stand/ walk >/= 45 min with </= 1/10 pain and no referral symptoms for functional endurance rquired for work related tasks (02/16/2017)   Time 6   Period Weeks   Status New     PT LONG TERM GOAL #4   Title increase FOTO score to </= 41% limited to demo improvement in function (02/16/17)   Time 6   Period Weeks   Status New     PT LONG TERM GOAL #5   Title pt to be I with all HEP given as of last visit (02/16/17)   Time 6   Period Weeks   Status New               Plan - 01/27/17 1229    Clinical Impression Statement Patient had no pain at end of session.  He was able to centralixe pain out of great toe with mat exercises.  He did have intermittantr thigh/groin pain with som exercises, brief.  These were resolved with change of position and rest.  Some of the pain he was havoing may well have been feelings of Muscles vs referred pain/.   PT Treatment/Interventions ADLs/Self Care Home Management;Therapeutic activities;Therapeutic exercise;Moist Heat;Traction;Cryotherapy;Electrical Stimulation;Iontophoresis 4mg /ml Dexamethasone;Dry needling;Taping;Manual techniques;Passive range of motion;Patient/family education;Neuromuscular re-education;Gait training;Ultrasound   PT Next Visit Plan Try to get to decrease lateral shift PRN,  progress extension PRN, core work, traction PRN, soft tissue work, modalities PRN   PT Home Exercise Plan prone  on elbows. pelvic tilt, decompression postion, single knee to chest, prone press-up, standing repeated extension, piriformis stretching, posture education,  manual trigger point release  soleus release   Consulted and Agree with Plan of Care Patient      Patient will benefit from skilled therapeutic intervention in order to improve the following deficits and impairments:  Abnormal gait, Pain, Improper body mechanics, Postural dysfunction, Decreased endurance, Decreased activity tolerance, Decreased balance, Decreased strength, Increased fascial restricitons  Visit Diagnosis: Chronic left-sided low back pain with left-sided sciatica  Muscle spasm of back  Muscle weakness (generalized)  Other abnormalities of gait and mobility     Problem List There are no active problems to display for this patient.   HARRIS,KAREN PTA 01/27/2017, 12:33 PM  [facility] [address] [place], [place], [address] Phone: [phone]   Fax:  [phone]  Name: Ralph Byrd MRN: [id number] Date of Birth: [birth year]

## 2017-02-01 ENCOUNTER — Encounter: Payer: Self-pay | Admitting: Physical Therapy

## 2017-02-01 ENCOUNTER — Ambulatory Visit: Payer: PRIVATE HEALTH INSURANCE | Admitting: Physical Therapy

## 2017-02-01 DIAGNOSIS — R2689 Other abnormalities of gait and mobility: Secondary | ICD-10-CM | POA: Diagnosis not present

## 2017-02-01 DIAGNOSIS — G8929 Other chronic pain: Secondary | ICD-10-CM

## 2017-02-01 DIAGNOSIS — M6281 Muscle weakness (generalized): Secondary | ICD-10-CM

## 2017-02-01 DIAGNOSIS — M62830 Muscle spasm of back: Secondary | ICD-10-CM | POA: Diagnosis not present

## 2017-02-01 DIAGNOSIS — M5442 Lumbago with sciatica, left side: Secondary | ICD-10-CM | POA: Diagnosis not present

## 2017-02-01 MED FILL — traMADol HCL 50 MG TABS: 50 | 5 days supply | Qty: 10 | Fill #0

## 2017-02-01 MED FILL — GABAPENTIN 300 MG CAPSULE: 300 | 30 days supply | Qty: 90 | Fill #0

## 2017-02-01 NOTE — Therapy (Signed)
[place], [place], [address] Phone: [phone]   Fax:  [phone]  Physical Therapy Treatment  Patient Details  Name: Ralph Byrd MRN: [id number] Date of Birth: 14-May-1956 Referring Provider: Frederik Pear MD  Encounter Date: 02/01/2017      PT End of Session - 02/01/17 1535    Visit Number 7   Number of Visits 12   Date for PT Re-Evaluation 02/16/17   Authorization - Visit Number 7   Authorization - Number of Visits 12   PT Start Time 0933   PT Stop Time 1030   PT Time Calculation (min) 57 min   Activity Tolerance Patient tolerated treatment well   Behavior During Therapy Ralph Byrd for tasks assessed/performed      Past Medical History:  Diagnosis Date  . Cancer [facility])    prostate cx, remission in 2012  . Hypertension     History reviewed. No pertinent surgical history.  There were no vitals filed for this visit.      Subjective Assessment - 02/01/17 1529    Subjective Pain was worse after stretching calf with HEP.  "I'm not doing those anymore,  I'm going back to the old exercise"     Currently in Pain? Yes   Pain Score 4    Pain Location Back   Pain Orientation Right;Left   Pain Descriptors / Indicators Constant   Pain Type Chronic pain   Pain Radiating Towards into groin and legs,  great toe is constant   Pain Frequency Constant   Aggravating Factors  calf stretches   Pain Relieving Factors other exercises.  stretches   Effect of Pain on Daily Activities Limited ADL,  sleeping limited   Multiple Pain Sites No                         [facility] Adult PT Treatment/Exercise - 02/01/17 0001      Lumbar Exercises: Stretches   Passive Hamstring Stretch 3 reps;30 seconds  decreased motion noted today   Prone on Elbows Stretch Limitations prone over 1 pillow,  No press up today     Lumbar Exercises: Aerobic   Stationary Bike Nu-step L5 x 6 min U/LE     Lumbar Exercises:  Supine   Bridge --  15   Bridge Limitations both legs     Lumbar Exercises: Prone   Other Prone Lumbar Exercises press 5 X over pillow     Moist Heat Therapy   Number Minutes Moist Heat 15 Minutes   Moist Heat Location Lumbar Spine     Electrical Stimulation   Electrical Stimulation Location low back at waist   Electrical Stimulation Action IFC   Electrical Stimulation Parameters 5   Electrical Stimulation Goals Pain                  PT Short Term Goals - 02/01/17 1537      PT SHORT TERM GOAL #1   Title pt to be I with inital HEP (01/26/2017)   Time 3   Period Weeks   Status Achieved     PT SHORT TERM GOAL #2   Title pt to verbalize/ demo proper posture/ lifting and carrying mechanics to prevent and reduce low back (01/26/2017)   Time 3   Period Weeks   Status Unable to assess     PT SHORT TERM GOAL #3   Title pt to reduce low back muscle tightness and  guarding to decrease pain to </= 4/10 and promote trunk mobility for therapuetic progression (01/26/2017)   Time 3   Period Weeks   Status Achieved     PT SHORT TERM GOAL #4   Title increase strength in LLE hip extensors/ abductors to >/= 4-/5 to promote hip stability / safety (01/26/2017)   Time 3   Period Weeks   Status Unable to assess           PT Long Term Goals - 01/05/17 1003      PT LONG TERM GOAL #1   Title pt to improve trunk flexion to >/= 70 degrees and extension/ R side bending by >/= 8 degrees with </= 1/10 pain and no LLE referral for functional trunk mobility (02/16/17)   Time 6   Period Weeks   Status New     PT LONG TERM GOAL #2   Title pt to increase LLE strength to >/= 4+/5 for safety with prolonged walking/ standing activities (02/16/17)   Time 6   Period Weeks   Status New     PT LONG TERM GOAL #3   Title pt to be able to sit, stand/ walk >/= 45 min with </= 1/10 pain and no referral symptoms for functional endurance rquired for work related tasks (02/16/2017)   Time 6   Period  Weeks   Status New     PT LONG TERM GOAL #4   Title increase FOTO score to </= 41% limited to demo improvement in function (02/16/17)   Time 6   Period Weeks   Status New     PT LONG TERM GOAL #5   Title pt to be I with all HEP given as of last visit (02/16/17)   Time 6   Period Weeks   Status New               Plan - 02/01/17 1535    Clinical Impression Statement Pain flare that patient relates to HEP of gastroc stretches.  Pain centralized from thighs to hips today.  Great toe pain is constant.  manual and modalities helpful.  Hopefully he will be able to return to more exercises next visit.   PT Treatment/Interventions ADLs/Self Care Home Management;Therapeutic activities;Therapeutic exercise;Moist Heat;Traction;Cryotherapy;Electrical Stimulation;Iontophoresis 4mg /ml Dexamethasone;Dry needling;Taping;Manual techniques;Passive range of motion;Patient/family education;Neuromuscular re-education;Gait training;Ultrasound   PT Next Visit Plan Try to get to decrease lateral shift PRN,  progress extension PRN, core work, traction PRN, soft tissue work, modalities PRN   PT Home Exercise Plan prone on elbows. pelvic tilt, decompression postion, single knee to chest, prone press-up, standing repeated extension, piriformis stretching, posture education, manual trigger point release  soleus release   Consulted and Agree with Plan of Care Patient      Patient will benefit from skilled therapeutic intervention in order to improve the following deficits and impairments:  Abnormal gait, Pain, Improper body mechanics, Postural dysfunction, Decreased endurance, Decreased activity tolerance, Decreased balance, Decreased strength, Increased fascial restricitons  Visit Diagnosis: Chronic left-sided low back pain with left-sided sciatica  Muscle spasm of back  Muscle weakness (generalized)  Other abnormalities of gait and mobility     Problem List There are no active problems to display for  this patient.   Ralph Byrd PTA 02/01/2017, 3:40 PM  [facility] [address] [place], [place], [address] Phone: [phone]   Fax:  [phone]  Name: Ralph Byrd MRN: [id number] Date of Birth: October 17, 1955

## 2017-02-03 ENCOUNTER — Ambulatory Visit: Payer: PRIVATE HEALTH INSURANCE | Admitting: Physical Therapy

## 2017-02-03 ENCOUNTER — Encounter: Payer: Self-pay | Admitting: Physical Therapy

## 2017-02-03 DIAGNOSIS — G8929 Other chronic pain: Secondary | ICD-10-CM | POA: Diagnosis not present

## 2017-02-03 DIAGNOSIS — M5442 Lumbago with sciatica, left side: Secondary | ICD-10-CM | POA: Diagnosis not present

## 2017-02-03 DIAGNOSIS — M6281 Muscle weakness (generalized): Secondary | ICD-10-CM

## 2017-02-03 DIAGNOSIS — R2689 Other abnormalities of gait and mobility: Secondary | ICD-10-CM | POA: Diagnosis not present

## 2017-02-03 DIAGNOSIS — M62830 Muscle spasm of back: Secondary | ICD-10-CM

## 2017-02-03 NOTE — Therapy (Signed)
[place], [place], [address] Phone: [phone]   Fax:  [phone]  Physical Therapy Treatment  Patient Details  Name: SHERRICK ARAKI MRN: [id number] Date of Birth: [birth year] Referring Provider: Frederik Pear MD  Encounter Date: 02/03/2017      PT End of Session - 02/03/17 1300    Visit Number 8   Number of Visits 12   Date for PT Re-Evaluation 02/16/17   Authorization - Visit Number 8   Authorization - Number of Visits 12   PT Start Time 0933   PT Stop Time 1016   PT Time Calculation (min) 43 min   Activity Tolerance Patient tolerated treatment well   Behavior During Therapy [facility] for tasks assessed/performed      Past Medical History:  Diagnosis Date  . Cancer [facility])    prostate cx, remission in 2012  . Hypertension     History reviewed. No pertinent surgical history.  There were no vitals filed for this visit.      Subjective Assessment - 02/03/17 0937    Subjective He has stopped calf stretches.  He is doing the other HEP exercises.   Currently in Pain? Yes   Pain Score 4    Pain Location Back   Pain Orientation Right;Left                         [facility] Adult PT Treatment/Exercise - 02/03/17 0001      Lumbar Exercises: Stretches   Prone on Elbows Stretch Limitations 1 minute     Lumbar Exercises: Standing   Other Standing Lumbar Exercises march in place- less pain able to increase weightbearing     Moist Heat Therapy   Number Minutes Moist Heat 20 Minutes   Moist Heat Location --  posterior leg, concurrent with manual and stretching     Manual Therapy   Joint Mobilization A/P fibular mobs,  tight about 5 inches from ankle at the sight of an old injury, ankle mobs with movement to increase DF  calcaneal distraction and mobilization with movement   Soft tissue mobilization Calf,  toe foot, thigh, posterior lateral   Myofascial Release distal calf   Passive ROM  calf, gentle, increased great toe pain., anterior hip stretch, hamstrings.   Kinesiotex Facilitate Muscle;Ligament Correction     Kinesiotix   Facilitate Muscle  Anterior tib   Ligament Correction Used to encourage posterior movement of fibula distal,  I strip                  PT Short Term Goals - 02/01/17 1537      PT SHORT TERM GOAL #1   Title pt to be I with inital HEP (01/26/2017)   Time 3   Period Weeks   Status Achieved     PT SHORT TERM GOAL #2   Title pt to verbalize/ demo proper posture/ lifting and carrying mechanics to prevent and reduce low back (01/26/2017)   Time 3   Period Weeks   Status Unable to assess     PT SHORT TERM GOAL #3   Title pt to reduce low back muscle tightness and guarding to decrease pain to </= 4/10 and promote trunk mobility for therapuetic progression (01/26/2017)   Time 3   Period Weeks   Status Achieved     PT SHORT TERM GOAL #4   Title increase strength in LLE hip extensors/ abductors to >/= 4-/5 to  promote hip stability / safety (01/26/2017)   Time 3   Period Weeks   Status Unable to assess           PT Long Term Goals - 01/05/17 1003      PT LONG TERM GOAL #1   Title pt to improve trunk flexion to >/= 70 degrees and extension/ R side bending by >/= 8 degrees with </= 1/10 pain and no LLE referral for functional trunk mobility (02/16/17)   Time 6   Period Weeks   Status New     PT LONG TERM GOAL #2   Title pt to increase LLE strength to >/= 4+/5 for safety with prolonged walking/ standing activities (02/16/17)   Time 6   Period Weeks   Status New     PT LONG TERM GOAL #3   Title pt to be able to sit, stand/ walk >/= 45 min with </= 1/10 pain and no referral symptoms for functional endurance rquired for work related tasks (02/16/2017)   Time 6   Period Weeks   Status New     PT LONG TERM GOAL #4   Title increase FOTO score to </= 41% limited to demo improvement in function (02/16/17)   Time 6   Period Weeks   Status  New     PT LONG TERM GOAL #5   Title pt to be I with all HEP given as of last visit (02/16/17)   Time 6   Period Weeks   Status New               Plan - 02/03/17 1302    Clinical Impression Statement Patient was able to have intermittant leg pain during session including great toe pain which is almost constantly present.  DF limited and painful to great toe,Manual helped decrease pain with standing and walking/.  Antalgic gait decreased.     PT Next Visit Plan Assess taping and manual .Try to get to decrease lateral shift PRN,  progress extension PRN, core work, traction PRN, soft tissue work, modalities PRN   PT Home Exercise Plan prone on elbows. pelvic tilt, decompression postion, single knee to chest, prone press-up, standing repeated extension, piriformis stretching, posture education, manual trigger point release  soleus release   Consulted and Agree with Plan of Care Patient      Patient will benefit from skilled therapeutic intervention in order to improve the following deficits and impairments:  Abnormal gait, Pain, Improper body mechanics, Postural dysfunction, Decreased endurance, Decreased activity tolerance, Decreased balance, Decreased strength, Increased fascial restricitons  Visit Diagnosis: Chronic left-sided low back pain with left-sided sciatica  Muscle spasm of back  Muscle weakness (generalized)  Other abnormalities of gait and mobility     Problem List There are no active problems to display for this patient.   HARRIS,KAREN  PTA 02/03/2017, 1:09 PM  [facility] [address] [place], [place], [address] Phone: [phone]   Fax:  [phone]  Name: AVRUM KIMBALL MRN: [id number] Date of Birth: 10/01/1955

## 2017-02-03 NOTE — Patient Instructions (Signed)
Remove tape if irritating 

## 2017-02-04 MED FILL — traMADol HCL 50 MG TABS: 50 | 25 days supply | Qty: 50 | Fill #1

## 2017-02-08 ENCOUNTER — Ambulatory Visit: Payer: PRIVATE HEALTH INSURANCE | Admitting: Physical Therapy

## 2017-02-08 ENCOUNTER — Encounter: Payer: Self-pay | Admitting: Physical Therapy

## 2017-02-08 DIAGNOSIS — R2689 Other abnormalities of gait and mobility: Secondary | ICD-10-CM | POA: Diagnosis not present

## 2017-02-08 DIAGNOSIS — M5442 Lumbago with sciatica, left side: Principal | ICD-10-CM

## 2017-02-08 DIAGNOSIS — M62830 Muscle spasm of back: Secondary | ICD-10-CM | POA: Diagnosis not present

## 2017-02-08 DIAGNOSIS — M6281 Muscle weakness (generalized): Secondary | ICD-10-CM

## 2017-02-08 DIAGNOSIS — G8929 Other chronic pain: Secondary | ICD-10-CM

## 2017-02-08 NOTE — Therapy (Signed)
[place], [place], [address] Phone: [phone]   Fax:  [phone]  Physical Therapy Treatment  Patient Details  Name: Ralph Byrd MRN: [id number] Date of Birth: 07-Aug-1955 Referring Provider: Frederik Pear MD  Encounter Date: 02/08/2017      PT End of Session - 02/08/17 0923    Visit Number 9   Number of Visits 12   Date for PT Re-Evaluation 02/16/17   PT Start Time 0925   PT Stop Time 1010   PT Time Calculation (min) 45 min   Activity Tolerance Patient tolerated treatment well   Behavior During Therapy [facility] for tasks assessed/performed      Past Medical History:  Diagnosis Date  . Cancer [facility])    prostate cx, remission in 2012  . Hypertension     History reviewed. No pertinent surgical history.  There were no vitals filed for this visit.      Subjective Assessment - 02/08/17 0922    Subjective "Since the last session my sypmtoms havd returned in my L hip to my L foot, the stretching exercise for my back makes it worse and I just can't do it"    Currently in Pain? Yes   Pain Score 4    Pain Location Back   Pain Orientation Left;Right   Pain Radiating Towards down the LLE in to the foot   Pain Onset More than a month ago   Pain Frequency Constant   Aggravating Factors  bending forward/ low back stretching   Pain Relieving Factors extension, pushing up from table            [facility] PT Assessment - 02/08/17 1011      Observation/Other Assessments   Focus on Therapeutic Outcomes (FOTO)  56% limited                     [facility] Adult PT Treatment/Exercise - 02/08/17 0929      Lumbar Exercises: Stretches   Active Hamstring Stretch 3 reps;30 seconds  contract/ relax with 10 sec hold   Passive Hamstring Stretch --   Lower Trunk Rotation --  1 x 10 staying within pain free range   Piriformis Stretch 2 reps;30 seconds     Lumbar Exercises: Aerobic   Stationary Bike  Nu-step L5 x 8 min U/LE  endurance retraining, reported reduced pain in LLE following     Knee/Hip Exercises: Supine   Straight Leg Raises 2 sets;10 reps;Left     Manual Therapy   Manual Therapy Neural Stretch;Muscle Energy Technique   Muscle Energy Technique prone resisted L hip flexion 10 x 10 in conjunction with L anterior innominate grade 3-4 mob   Neural Stretch sciatic nerve glides supine hamstring stretch with ankle pumps, seated with knee flxion/ extension and neck/ flexion/ extension                PT Education - 02/08/17 0957    Education provided Yes   Education Details effects of the muscles on the innominate and benefits of exericse. strengthening muscles and effects that can causing feeling of burning but isn't causing damage.    Person(s) Educated Patient   Methods Explanation;Handout;Demonstration;Verbal cues   Comprehension Verbalized understanding;Returned demonstration;Verbal cues required;Tactile cues required          PT Short Term Goals - 02/01/17 1537      PT SHORT TERM GOAL #1   Title pt to be I with inital HEP (01/26/2017)  Time 3   Period Weeks   Status Achieved     PT SHORT TERM GOAL #2   Title pt to verbalize/ demo proper posture/ lifting and carrying mechanics to prevent and reduce low back (01/26/2017)   Time 3   Period Weeks   Status Unable to assess     PT SHORT TERM GOAL #3   Title pt to reduce low back muscle tightness and guarding to decrease pain to </= 4/10 and promote trunk mobility for therapuetic progression (01/26/2017)   Time 3   Period Weeks   Status Achieved     PT SHORT TERM GOAL #4   Title increase strength in LLE hip extensors/ abductors to >/= 4-/5 to promote hip stability / safety (01/26/2017)   Time 3   Period Weeks   Status Unable to assess           PT Long Term Goals - 01/05/17 1003      PT LONG TERM GOAL #1   Title pt to improve trunk flexion to >/= 70 degrees and extension/ R side bending by >/= 8  degrees with </= 1/10 pain and no LLE referral for functional trunk mobility (02/16/17)   Time 6   Period Weeks   Status New     PT LONG TERM GOAL #2   Title pt to increase LLE strength to >/= 4+/5 for safety with prolonged walking/ standing activities (02/16/17)   Time 6   Period Weeks   Status New     PT LONG TERM GOAL #3   Title pt to be able to sit, stand/ walk >/= 45 min with </= 1/10 pain and no referral symptoms for functional endurance rquired for work related tasks (02/16/2017)   Time 6   Period Weeks   Status New     PT LONG TERM GOAL #4   Title increase FOTO score to </= 41% limited to demo improvement in function (02/16/17)   Time 6   Period Weeks   Status New     PT LONG TERM GOAL #5   Title pt to be I with all HEP given as of last visit (02/16/17)   Time 6   Period Weeks   Status New               Plan - 02/08/17 7867    Clinical Impression Statement pt reported increased pain since the last session to 4/10 and LLE referral symptoms to the foot. Further assessment revealed posteriorly rotated innominate on the L. focused on MEt techniques and hamstrting stretching to reset innominate. pt requires multiple and frequent verbal cues about benefits of exericse, and to focus on movement. pt reported pain dropped to 3/10 post session.    PT Next Visit Plan posteriorly rotated innominate on L. Try to get to decrease lateral shift PRN,  progress extension PRN, core work, traction PRN, soft tissue work, modalities PRN   PT Home Exercise Plan prone on elbows. pelvic tilt, decompression postion, single knee to chest, prone press-up, standing repeated extension, piriformis stretching, posture education, manual trigger point release  soleus release   Consulted and Agree with Plan of Care Patient      Patient will benefit from skilled therapeutic intervention in order to improve the following deficits and impairments:  Abnormal gait, Pain, Improper body mechanics, Postural  dysfunction, Decreased endurance, Decreased activity tolerance, Decreased balance, Decreased strength, Increased fascial restricitons  Visit Diagnosis: Chronic left-sided low back pain with left-sided sciatica  Muscle spasm  of back  Muscle weakness (generalized)  Other abnormalities of gait and mobility     Problem List There are no active problems to display for this patient.  [name] PT, DPT, LAT, ATC  02/08/17  10:13 AM      [facility] [address] [place], [place], [address] Phone: [phone]   Fax:  [phone]  Name: Ralph Byrd MRN: [id number] Date of Birth: [birth year]

## 2017-02-10 ENCOUNTER — Ambulatory Visit: Payer: PRIVATE HEALTH INSURANCE | Attending: Family Medicine | Admitting: Physical Therapy

## 2017-02-10 ENCOUNTER — Encounter: Payer: Self-pay | Admitting: Physical Therapy

## 2017-02-10 DIAGNOSIS — M6281 Muscle weakness (generalized): Secondary | ICD-10-CM | POA: Insufficient documentation

## 2017-02-10 DIAGNOSIS — R2689 Other abnormalities of gait and mobility: Secondary | ICD-10-CM | POA: Diagnosis not present

## 2017-02-10 DIAGNOSIS — M5442 Lumbago with sciatica, left side: Secondary | ICD-10-CM | POA: Diagnosis not present

## 2017-02-10 DIAGNOSIS — G8929 Other chronic pain: Secondary | ICD-10-CM | POA: Diagnosis not present

## 2017-02-10 DIAGNOSIS — M62830 Muscle spasm of back: Secondary | ICD-10-CM | POA: Insufficient documentation

## 2017-02-10 NOTE — Therapy (Addendum)
[place], [place], [address] Phone: [phone]   Fax:  [phone]  Physical Therapy Treatment / Discharge Summary  Patient Details  Name: Ralph Byrd MRN: [id number] Date of Birth: April 28, 1956 Referring Provider: Frederik Pear MD  Encounter Date: 02/10/2017      PT End of Session - 02/10/17 1231    Visit Number 10   Number of Visits 12   Date for PT Re-Evaluation 02/16/17   PT Start Time 0916   PT Stop Time 1010   PT Time Calculation (min) 54 min   Activity Tolerance Patient tolerated treatment well   Behavior During Therapy [facility] for tasks assessed/performed      Past Medical History:  Diagnosis Date  . Cancer [facility])    prostate cx, remission in 2012  . Hypertension     History reviewed. No pertinent surgical history.  There were no vitals filed for this visit.      Subjective Assessment - 02/10/17 0920    Subjective Pain 5/10 in am,  3-4/10 in the afternoon.  Patient now able to sleep better.   Currently in Pain? Yes   Pain Score 5    Pain Location Back   Pain Orientation Left;Right   Pain Descriptors / Indicators Constant  varies   Pain Type Chronic pain   Pain Radiating Towards LLE to foot   Pain Frequency Constant   Aggravating Factors  am,  bending,  stretching in low back,  turning to his left side in the bed.   Pain Relieving Factors exercises,    Effect of Pain on Daily Activities Limited  walking standing,  unable to lift   Multiple Pain Sites No            [facility] PT Assessment - 02/10/17 0001      Strength   Left Hip Extension 4/5   Left Hip ABduction 4-/5                     [facility] Adult PT Treatment/Exercise - 02/10/17 0001      Lumbar Exercises: Stretches   Passive Hamstring Stretch 3 reps;30 seconds   Pelvic Tilt Limitations 10 X with noted abdominal  buldge   Piriformis Stretch 2 reps;30 seconds     Lumbar Exercises: Aerobic   Stationary Bike  Nu-step L7 x 8 min U/LE  endurance retraining, reported reduced pain in LLE following     Lumbar Exercises: Standing   Other Standing Lumbar Exercises march in place- less pain able to increase weightbearing     Lumbar Exercises: Supine   Clam 10 reps   Clam Limitations green band,    Bent Knee Raise 10 reps   Bent Knee Raise Limitations cued for abdominal engagement to prevent pain     Knee/Hip Exercises: Stretches   Quad Stretch 3 reps;30 seconds   Quad Stretch Limitations over edge of mat concurrent with anterior hip stretch     Knee/Hip Exercises: Supine   Straight Leg Raises 2 sets;10 reps;Left  also right     Moist Heat Therapy   Number Minutes Moist Heat 8 Minutes   Moist Heat Location --  hamstrings, distal to provimal  during soft tissue work     Manual Therapy   Manual Therapy Neural Stretch;Muscle Energy Technique   Manual therapy comments sitting   Soft tissue mobilization hamstringe Left, instrument assist.    Muscle Energy Technique SUPINE RESISTED HIP 3 x 3 REPS, ,  CONTRACT RELAX for hamstrings left   Neural Stretch seated neural glides with leg lift/head lift 8 X                PT Education - 02/10/17 1231    Education provided Yes   Education Details Hamstring stretch, contract relax   Person(s) Educated Patient   Methods Explanation;Demonstration   Comprehension Verbalized understanding          PT Short Term Goals - 02/10/17 0925      PT SHORT TERM GOAL #1   Title pt to be I with inital HEP (01/26/2017)   Time 3   Period Weeks   Status Achieved     PT SHORT TERM GOAL #2   Title pt to verbalize/ demo proper posture/ lifting and carrying mechanics to prevent and reduce low back (01/26/2017)   Baseline Able to demo and verbalize light items and ADL's   Time 3   Period Weeks   Status Achieved     PT SHORT TERM GOAL #3   Title pt to reduce low back muscle tightness and guarding to decrease pain to </= 4/10 and promote trunk mobility  for therapuetic progression (01/26/2017)   Baseline Had achieved at a earlier date,, pain has now increased to 5/10   Time 3   Period Weeks   Status On-going     PT SHORT TERM GOAL #4   Title increase strength in LLE hip extensors/ abductors to >/= 4-/5 to promote hip stability / safety (01/26/2017)   Baseline LT hip extension 4/5,  LT hip abduction4-/5   Time 3   Period Weeks   Status Achieved           PT Long Term Goals - 01/05/17 1003      PT LONG TERM GOAL #1   Title pt to improve trunk flexion to >/= 70 degrees and extension/ R side bending by >/= 8 degrees with </= 1/10 pain and no LLE referral for functional trunk mobility (02/16/17)   Time 6   Period Weeks   Status New     PT LONG TERM GOAL #2   Title pt to increase LLE strength to >/= 4+/5 for safety with prolonged walking/ standing activities (02/16/17)   Time 6   Period Weeks   Status New     PT LONG TERM GOAL #3   Title pt to be able to sit, stand/ walk >/= 45 min with </= 1/10 pain and no referral symptoms for functional endurance rquired for work related tasks (02/16/2017)   Time 6   Period Weeks   Status New     PT LONG TERM GOAL #4   Title increase FOTO score to </= 41% limited to demo improvement in function (02/16/17)   Time 6   Period Weeks   Status New     PT LONG TERM GOAL #5   Title pt to be I with all HEP given as of last visit (02/16/17)   Time 6   Period Weeks   Status New               Plan - 02/10/17 1232    Clinical Impression Statement Less antalgic gait post session.  Hamstring work helpful. Leg pain continues to be reported.  Strength hip 4-/5 abduction and extension 4/5 left.   All STG's met except for pain goal. (STG#3)   PT Treatment/Interventions ADLs/Self Care Home Management;Therapeutic activities;Therapeutic exercise;Moist Heat;Traction;Cryotherapy;Electrical Stimulation;Iontophoresis 64m/ml Dexamethasone;Dry needling;Taping;Manual techniques;Passive range of  motion;Patient/family  education;Neuromuscular re-education;Gait training;Ultrasound   PT Next Visit Plan posteriorly rotated innominate on L. Try to get to decrease lateral shift PRN,  progress extension PRN, core work, traction PRN, soft tissue work, modalities PRN   PT Home Exercise Plan prone on elbows. pelvic tilt, decompression postion, single knee to chest, prone press-up, standing repeated extension, piriformis stretching, posture education, manual trigger point release  soleus release.  Hamstring contract/ relax left only   Consulted and Agree with Plan of Care Patient      Patient will benefit from skilled therapeutic intervention in order to improve the following deficits and impairments:  Abnormal gait, Pain, Improper body mechanics, Postural dysfunction, Decreased endurance, Decreased activity tolerance, Decreased balance, Decreased strength, Increased fascial restricitons  Visit Diagnosis: Chronic left-sided low back pain with left-sided sciatica  Muscle spasm of back  Muscle weakness (generalized)  Other abnormalities of gait and mobility     Problem List There are no active problems to display for this patient.   Jacobi Nile PTA 02/10/2017, 12:57 PM  [facility] [address] [place], [place], [address] Phone: [phone]   Fax:  [phone]  Name: Ralph Byrd MRN: [id number] Date of Birth: 01/08/1956     PHYSICAL THERAPY DISCHARGE SUMMARY  Visits from Start of Care: 10  Current functional level related to goals / functional outcomes: See goals   Remaining deficits: unknown   Education / Equipment: HEP, theraband, posture/ lifting mechanics  Plan: Patient agrees to discharge.  Patient goals were not met. Patient is being discharged due to not returning since the last visit.  ?????     Kristoffer Leamon PT, DPT, LAT, ATC  03/18/17  9:39 AM

## 2017-02-10 NOTE — Patient Instructions (Signed)
Flexion: Stretch - Hamstrings (Supine) Variations for 4a    Variation: Perform with foot pointed out to side. Perform with foot pointed slightly in. Helper may kneel on bed, using shoulder to support patient's leg. Do not bend hip past 90. Bend resting leg. Contract method: Resist _10__ seconds. (see card G.G.-14) CAUTION: Do not allow knee to go beyond straight.   May do on wall at home in doorway.  3 X  Copyright  VHI. All rights reserved.

## 2017-03-10 MED FILL — HYDROCHLOROTHIAZIDE 25 MG T: 25 | 90 days supply | Qty: 90 | Fill #1

## 2017-03-23 MED FILL — GABAPENTIN 300 MG CAPSULE: 300 | 30 days supply | Qty: 90 | Fill #0

## 2017-03-31 MED FILL — CLOPIDOGREL 75 MG TABLET: 75 | 90 days supply | Qty: 90 | Fill #1

## 2017-03-31 MED FILL — AMLODIPINE BESYLATE 5 MG TA: 5 | 90 days supply | Qty: 90 | Fill #1

## 2017-04-12 MED FILL — PRAVASTATIN SODIUM 20 MG TA: 20 | 90 days supply | Qty: 90 | Fill #2

## 2017-05-05 DIAGNOSIS — R7301 Impaired fasting glucose: Secondary | ICD-10-CM | POA: Diagnosis not present

## 2017-05-05 DIAGNOSIS — N529 Male erectile dysfunction, unspecified: Secondary | ICD-10-CM | POA: Diagnosis not present

## 2017-05-05 DIAGNOSIS — Z8673 Personal history of transient ischemic attack (TIA), and cerebral infarction without residual deficits: Secondary | ICD-10-CM | POA: Diagnosis not present

## 2017-05-05 DIAGNOSIS — R6882 Decreased libido: Secondary | ICD-10-CM | POA: Diagnosis not present

## 2017-05-05 DIAGNOSIS — I10 Essential (primary) hypertension: Secondary | ICD-10-CM | POA: Diagnosis not present

## 2017-05-05 DIAGNOSIS — Z8546 Personal history of malignant neoplasm of prostate: Secondary | ICD-10-CM | POA: Diagnosis not present

## 2017-05-05 DIAGNOSIS — E7800 Pure hypercholesterolemia, unspecified: Secondary | ICD-10-CM | POA: Diagnosis not present

## 2017-05-05 DIAGNOSIS — M48062 Spinal stenosis, lumbar region with neurogenic claudication: Secondary | ICD-10-CM | POA: Diagnosis not present

## 2017-05-05 MED FILL — DICLOFENAC SOD 75 MG TAB EC: 75 | 30 days supply | Qty: 60 | Fill #0

## 2017-05-20 DIAGNOSIS — R6882 Decreased libido: Secondary | ICD-10-CM | POA: Diagnosis not present

## 2017-05-28 DIAGNOSIS — E291 Testicular hypofunction: Secondary | ICD-10-CM | POA: Diagnosis not present

## 2017-06-01 MED FILL — TESTOSTERONE 20.25 MG/ACT (: 20.25 MG/AC | 30 days supply | Qty: 75 | Fill #0

## 2017-06-07 MED FILL — HYDROCHLOROTHIAZIDE 25 MG T: 25 | 90 days supply | Qty: 90 | Fill #2

## 2017-06-09 MED FILL — traMADol HCL 50 MG TABS: 50 | 30 days supply | Qty: 90 | Fill #0

## 2017-06-09 MED FILL — GABAPENTIN 300 MG CAPSULE: 300 | 30 days supply | Qty: 90 | Fill #0

## 2017-06-09 MED FILL — DICLOFENAC SODIUM 1% GEL: 1 | 25 days supply | Qty: 400 | Fill #0

## 2017-06-28 MED FILL — CLOPIDOGREL 75 MG TABLET: 75 | 90 days supply | Qty: 90 | Fill #2

## 2017-06-28 MED FILL — AMLODIPINE BESYLATE 5 MG TA: 5 | 90 days supply | Qty: 90 | Fill #2

## 2017-06-29 MED FILL — TESTOSTERONE 20.25 MG/ACT (: 20.25 MG/AC | 30 days supply | Qty: 75 | Fill #1

## 2017-07-02 ENCOUNTER — Encounter (HOSPITAL_COMMUNITY): Payer: Self-pay

## 2017-07-02 ENCOUNTER — Observation Stay (HOSPITAL_COMMUNITY)
Admission: EM | Admit: 2017-07-02 | Discharge: 2017-07-03 | Disposition: A | Discharge status: Home / Self Care | Payer: PRIVATE HEALTH INSURANCE | Attending: Neurological Surgery | Admitting: Neurological Surgery

## 2017-07-02 ENCOUNTER — Other Ambulatory Visit: Payer: Self-pay

## 2017-07-02 ENCOUNTER — Emergency Department (HOSPITAL_COMMUNITY): Payer: PRIVATE HEALTH INSURANCE

## 2017-07-02 ENCOUNTER — Emergency Department (HOSPITAL_COMMUNITY): Payer: PRIVATE HEALTH INSURANCE | Admitting: Certified Registered"

## 2017-07-02 ENCOUNTER — Encounter (HOSPITAL_COMMUNITY)
Admission: EM | Disposition: A | Discharge status: Home / Self Care | Payer: Self-pay | Source: Home / Self Care | Attending: Emergency Medicine

## 2017-07-02 DIAGNOSIS — Z419 Encounter for procedure for purposes other than remedying health state, unspecified: Secondary | ICD-10-CM

## 2017-07-02 DIAGNOSIS — Z8546 Personal history of malignant neoplasm of prostate: Secondary | ICD-10-CM | POA: Insufficient documentation

## 2017-07-02 DIAGNOSIS — R531 Weakness: Secondary | ICD-10-CM | POA: Diagnosis not present

## 2017-07-02 DIAGNOSIS — M1288 Other specific arthropathies, not elsewhere classified, other specified site: Secondary | ICD-10-CM | POA: Diagnosis not present

## 2017-07-02 DIAGNOSIS — I10 Essential (primary) hypertension: Secondary | ICD-10-CM | POA: Diagnosis not present

## 2017-07-02 DIAGNOSIS — Z7982 Long term (current) use of aspirin: Secondary | ICD-10-CM | POA: Insufficient documentation

## 2017-07-02 DIAGNOSIS — M4316 Spondylolisthesis, lumbar region: Secondary | ICD-10-CM | POA: Diagnosis not present

## 2017-07-02 DIAGNOSIS — M7138 Other bursal cyst, other site: Secondary | ICD-10-CM | POA: Diagnosis not present

## 2017-07-02 DIAGNOSIS — M47816 Spondylosis without myelopathy or radiculopathy, lumbar region: Secondary | ICD-10-CM | POA: Diagnosis not present

## 2017-07-02 DIAGNOSIS — M545 Low back pain: Secondary | ICD-10-CM | POA: Diagnosis not present

## 2017-07-02 DIAGNOSIS — M48061 Spinal stenosis, lumbar region without neurogenic claudication: Secondary | ICD-10-CM | POA: Insufficient documentation

## 2017-07-02 DIAGNOSIS — M4696 Unspecified inflammatory spondylopathy, lumbar region: Secondary | ICD-10-CM | POA: Insufficient documentation

## 2017-07-02 DIAGNOSIS — M479 Spondylosis, unspecified: Secondary | ICD-10-CM | POA: Diagnosis present

## 2017-07-02 DIAGNOSIS — M961 Postlaminectomy syndrome, not elsewhere classified: Secondary | ICD-10-CM | POA: Diagnosis not present

## 2017-07-02 DIAGNOSIS — M5416 Radiculopathy, lumbar region: Secondary | ICD-10-CM | POA: Diagnosis present

## 2017-07-02 DIAGNOSIS — M5116 Intervertebral disc disorders with radiculopathy, lumbar region: Secondary | ICD-10-CM | POA: Diagnosis not present

## 2017-07-02 DIAGNOSIS — M79605 Pain in left leg: Secondary | ICD-10-CM | POA: Diagnosis not present

## 2017-07-02 HISTORY — PX: LUMBAR LAMINECTOMY/DECOMPRESSION MICRODISCECTOMY: SHX5026

## 2017-07-02 LAB — I-STAT CHEM 8, ED
BUN: 22 mg/dL — AB (ref 6–20)
CHLORIDE: 103 mmol/L (ref 101–111)
CREATININE: 1.2 mg/dL (ref 0.61–1.24)
Calcium, Ion: 1.18 mmol/L (ref 1.15–1.40)
Glucose, Bld: 97 mg/dL (ref 65–99)
HEMATOCRIT: 45 % (ref 39.0–52.0)
Hemoglobin: 15.3 g/dL (ref 13.0–17.0)
Potassium: 3.5 mmol/L (ref 3.5–5.1)
Sodium: 141 mmol/L (ref 135–145)
TCO2: 24 mmol/L (ref 22–32)

## 2017-07-02 SURGERY — LUMBAR LAMINECTOMY/DECOMPRESSION MICRODISCECTOMY 1 LEVEL
Anesthesia: General | Site: Back | Laterality: Bilateral

## 2017-07-02 MED ORDER — ONDANSETRON HCL 4 MG/2ML IJ SOLN: 4.0000 mg | Freq: Four times a day (QID) | INTRAMUSCULAR | Status: DC | PRN

## 2017-07-02 MED ORDER — BUPIVACAINE HCL (PF) 0.5 % IJ SOLN
INTRAMUSCULAR | Status: AC
  Filled 2017-07-02: qty 30

## 2017-07-02 MED ORDER — MIDAZOLAM HCL 2 MG/2ML IJ SOLN
INTRAMUSCULAR | Status: DC | PRN
  Administered 2017-07-02: 2 mg via INTRAVENOUS

## 2017-07-02 MED ORDER — SODIUM CHLORIDE 0.9% FLUSH: 3.0000 mL | INTRAVENOUS | Status: DC | PRN

## 2017-07-02 MED ORDER — SUGAMMADEX SODIUM 200 MG/2ML IV SOLN
INTRAVENOUS | Status: AC
  Filled 2017-07-02: qty 2

## 2017-07-02 MED ORDER — METHOCARBAMOL 500 MG PO TABS
500.0000 mg | ORAL_TABLET | Freq: Four times a day (QID) | ORAL | Status: DC | PRN
  Administered 2017-07-03 (×2): 500 mg via ORAL
  Filled 2017-07-02 (×2): qty 1

## 2017-07-02 MED ORDER — POLYETHYLENE GLYCOL 3350 17 G PO PACK: 17.0000 g | PACK | Freq: Every day | ORAL | Status: DC | PRN

## 2017-07-02 MED ORDER — CLOPIDOGREL BISULFATE 75 MG PO TABS
75.0000 mg | ORAL_TABLET | Freq: Every day | ORAL | Status: DC
  Filled 2017-07-02: qty 1

## 2017-07-02 MED ORDER — LIDOCAINE-EPINEPHRINE 1 %-1:100000 IJ SOLN
INTRAMUSCULAR | Status: DC | PRN
  Administered 2017-07-02: 5 mL via INTRADERMAL

## 2017-07-02 MED ORDER — KETOROLAC TROMETHAMINE 30 MG/ML IJ SOLN
INTRAMUSCULAR | Status: DC | PRN
  Administered 2017-07-02: 15 mg via INTRAVENOUS

## 2017-07-02 MED ORDER — HYDROCHLOROTHIAZIDE 25 MG PO TABS
25.0000 mg | ORAL_TABLET | Freq: Every day | ORAL | Status: DC
  Administered 2017-07-03: 25 mg via ORAL
  Filled 2017-07-02: qty 1

## 2017-07-02 MED ORDER — SODIUM CHLORIDE 0.9% FLUSH: 3.0000 mL | Freq: Two times a day (BID) | INTRAVENOUS | Status: DC

## 2017-07-02 MED ORDER — KETOROLAC TROMETHAMINE 30 MG/ML IJ SOLN
INTRAMUSCULAR | Status: AC
  Filled 2017-07-02: qty 2

## 2017-07-02 MED ORDER — AMLODIPINE BESYLATE 5 MG PO TABS
5.0000 mg | ORAL_TABLET | Freq: Every day | ORAL | Status: DC
  Administered 2017-07-03: 5 mg via ORAL
  Filled 2017-07-02 (×2): qty 1

## 2017-07-02 MED ORDER — DEXAMETHASONE SODIUM PHOSPHATE 10 MG/ML IJ SOLN
10.0000 mg | Freq: Once | INTRAMUSCULAR | Status: AC
  Administered 2017-07-02: 10 mg via INTRAVENOUS
  Filled 2017-07-02: qty 1

## 2017-07-02 MED ORDER — ALUM & MAG HYDROXIDE-SIMETH 200-200-20 MG/5ML PO SUSP: 30.0000 mL | Freq: Four times a day (QID) | ORAL | Status: DC | PRN

## 2017-07-02 MED ORDER — ROCURONIUM BROMIDE 10 MG/ML (PF) SYRINGE
PREFILLED_SYRINGE | INTRAVENOUS | Status: AC
  Filled 2017-07-02: qty 5

## 2017-07-02 MED ORDER — LIDOCAINE-EPINEPHRINE 1 %-1:100000 IJ SOLN
INTRAMUSCULAR | Status: AC
  Filled 2017-07-02: qty 1

## 2017-07-02 MED ORDER — METHOCARBAMOL 1000 MG/10ML IJ SOLN: 500.0000 mg | Freq: Four times a day (QID) | INTRAVENOUS | Status: DC | PRN

## 2017-07-02 MED ORDER — FENTANYL CITRATE (PF) 100 MCG/2ML IJ SOLN
50.0000 ug | Freq: Once | INTRAMUSCULAR | Status: AC
  Administered 2017-07-02: 50 ug via INTRAVENOUS
  Filled 2017-07-02: qty 2

## 2017-07-02 MED ORDER — ONDANSETRON HCL 4 MG/2ML IJ SOLN
INTRAMUSCULAR | Status: DC | PRN
  Administered 2017-07-02: 4 mg via INTRAVENOUS

## 2017-07-02 MED ORDER — DEXAMETHASONE SODIUM PHOSPHATE 10 MG/ML IJ SOLN
INTRAMUSCULAR | Status: DC | PRN
  Administered 2017-07-02: 10 mg via INTRAVENOUS

## 2017-07-02 MED ORDER — CEFAZOLIN SODIUM-DEXTROSE 2-4 GM/100ML-% IV SOLN
2.0000 g | Freq: Three times a day (TID) | INTRAVENOUS | Status: DC
  Administered 2017-07-03: 2 g via INTRAVENOUS
  Filled 2017-07-02: qty 100

## 2017-07-02 MED ORDER — BISACODYL 10 MG RE SUPP: 10.0000 mg | Freq: Every day | RECTAL | Status: DC | PRN

## 2017-07-02 MED ORDER — DOCUSATE SODIUM 100 MG PO CAPS
100.0000 mg | ORAL_CAPSULE | Freq: Two times a day (BID) | ORAL | Status: DC
  Administered 2017-07-03 (×2): 100 mg via ORAL
  Filled 2017-07-02 (×2): qty 1

## 2017-07-02 MED ORDER — CEFAZOLIN SODIUM-DEXTROSE 2-3 GM-%(50ML) IV SOLR
INTRAVENOUS | Status: DC | PRN
  Administered 2017-07-02: 2 g via INTRAVENOUS

## 2017-07-02 MED ORDER — LIDOCAINE HCL (CARDIAC) 20 MG/ML IV SOLN
INTRAVENOUS | Status: DC | PRN
  Administered 2017-07-02: 100 mg via INTRATRACHEAL

## 2017-07-02 MED ORDER — GLYCOPYRROLATE 0.2 MG/ML IJ SOLN
INTRAMUSCULAR | Status: DC | PRN
  Administered 2017-07-02: 0.2 mg via INTRAVENOUS

## 2017-07-02 MED ORDER — SODIUM CHLORIDE 0.9 % IR SOLN
Status: DC | PRN
  Administered 2017-07-02: 500 mL

## 2017-07-02 MED ORDER — ROCURONIUM 10MG/ML (10ML) SYRINGE FOR MEDFUSION PUMP - OPTIME
INTRAVENOUS | Status: DC | PRN
  Administered 2017-07-02: 5 mg via INTRAVENOUS

## 2017-07-02 MED ORDER — GABAPENTIN 300 MG PO CAPS
300.0000 mg | ORAL_CAPSULE | Freq: Three times a day (TID) | ORAL | Status: DC
  Administered 2017-07-03 (×2): 300 mg via ORAL
  Filled 2017-07-02 (×2): qty 1

## 2017-07-02 MED ORDER — ACETAMINOPHEN 650 MG RE SUPP
650.0000 mg | RECTAL | Status: DC | PRN
Start: 2017-07-02 — End: 2017-07-03

## 2017-07-02 MED ORDER — TRAMADOL HCL 50 MG PO TABS
50.0000 mg | ORAL_TABLET | Freq: Two times a day (BID) | ORAL | Status: DC
  Administered 2017-07-03: 50 mg via ORAL
  Filled 2017-07-02: qty 1

## 2017-07-02 MED ORDER — ONDANSETRON HCL 4 MG PO TABS
4.0000 mg | ORAL_TABLET | Freq: Four times a day (QID) | ORAL | Status: DC | PRN
Start: 2017-07-02 — End: 2017-07-03

## 2017-07-02 MED ORDER — PHENYLEPHRINE HCL 10 MG/ML IJ SOLN: INTRAMUSCULAR | Status: DC | PRN

## 2017-07-02 MED ORDER — ONDANSETRON HCL 4 MG/2ML IJ SOLN
4.0000 mg | Freq: Once | INTRAMUSCULAR | Status: AC
  Administered 2017-07-02: 4 mg via INTRAVENOUS
  Filled 2017-07-02: qty 2

## 2017-07-02 MED ORDER — 0.9 % SODIUM CHLORIDE (POUR BTL) OPTIME
TOPICAL | Status: DC | PRN
  Administered 2017-07-02: 1000 mL

## 2017-07-02 MED ORDER — BUPIVACAINE HCL (PF) 0.5 % IJ SOLN
INTRAMUSCULAR | Status: DC | PRN
  Administered 2017-07-02: 20 mL
  Administered 2017-07-02: 5 mL

## 2017-07-02 MED ORDER — MIDAZOLAM HCL 2 MG/2ML IJ SOLN
INTRAMUSCULAR | Status: AC
  Filled 2017-07-02: qty 2

## 2017-07-02 MED ORDER — PROPOFOL 10 MG/ML IV BOLUS
INTRAVENOUS | Status: DC | PRN
  Administered 2017-07-02: 130 mg via INTRAVENOUS

## 2017-07-02 MED ORDER — KETOROLAC TROMETHAMINE 30 MG/ML IJ SOLN
30.0000 mg | Freq: Once | INTRAMUSCULAR | Status: AC
  Administered 2017-07-02: 30 mg via INTRAVENOUS
  Filled 2017-07-02: qty 1

## 2017-07-02 MED ORDER — PHENYLEPHRINE 40 MCG/ML (10ML) SYRINGE FOR IV PUSH (FOR BLOOD PRESSURE SUPPORT)
PREFILLED_SYRINGE | INTRAVENOUS | Status: AC
  Filled 2017-07-02: qty 10

## 2017-07-02 MED ORDER — PHENYLEPHRINE HCL 10 MG/ML IJ SOLN
INTRAMUSCULAR | Status: DC | PRN
  Administered 2017-07-02 (×2): 80 ug via INTRAVENOUS
  Administered 2017-07-02: 160 ug via INTRAVENOUS
  Administered 2017-07-02: 80 ug via INTRAVENOUS

## 2017-07-02 MED ORDER — CEFAZOLIN SODIUM 1 G IJ SOLR
INTRAMUSCULAR | Status: AC
  Filled 2017-07-02: qty 20

## 2017-07-02 MED ORDER — ACETAMINOPHEN 325 MG PO TABS: 650.0000 mg | ORAL_TABLET | ORAL | Status: DC | PRN

## 2017-07-02 MED ORDER — PROPOFOL 10 MG/ML IV BOLUS
INTRAVENOUS | Status: AC
  Filled 2017-07-02: qty 20

## 2017-07-02 MED ORDER — DEXAMETHASONE SODIUM PHOSPHATE 10 MG/ML IJ SOLN
INTRAMUSCULAR | Status: AC
  Filled 2017-07-02: qty 1

## 2017-07-02 MED ORDER — FLEET ENEMA 7-19 GM/118ML RE ENEM: 1.0000 | ENEMA | Freq: Once | RECTAL | Status: DC | PRN

## 2017-07-02 MED ORDER — LACTATED RINGERS IV SOLN
INTRAVENOUS | Status: DC | PRN
  Administered 2017-07-02 (×2): via INTRAVENOUS

## 2017-07-02 MED ORDER — HYDROCODONE-ACETAMINOPHEN 5-325 MG PO TABS
1.0000 | ORAL_TABLET | ORAL | Status: DC | PRN
  Administered 2017-07-03: 1 via ORAL
  Filled 2017-07-02: qty 1

## 2017-07-02 MED ORDER — LIDOCAINE 2% (20 MG/ML) 5 ML SYRINGE
INTRAMUSCULAR | Status: AC
  Filled 2017-07-02: qty 5

## 2017-07-02 MED ORDER — MORPHINE SULFATE (PF) 4 MG/ML IV SOLN: 2.0000 mg | INTRAVENOUS | Status: DC | PRN

## 2017-07-02 MED ORDER — SODIUM CHLORIDE 0.9 % IV SOLN: 250.0000 mL | INTRAVENOUS | Status: DC

## 2017-07-02 MED ORDER — SUGAMMADEX SODIUM 200 MG/2ML IV SOLN
INTRAVENOUS | Status: DC | PRN
  Administered 2017-07-02: 200 mg via INTRAVENOUS

## 2017-07-02 MED ORDER — SENNA 8.6 MG PO TABS
1.0000 | ORAL_TABLET | Freq: Two times a day (BID) | ORAL | Status: DC
  Administered 2017-07-03 (×2): 8.6 mg via ORAL
  Filled 2017-07-02 (×2): qty 1

## 2017-07-02 MED ORDER — PHENYLEPHRINE HCL 10 MG/ML IJ SOLN
INTRAVENOUS | Status: DC | PRN
  Administered 2017-07-02: 20 ug/min via INTRAVENOUS

## 2017-07-02 MED ORDER — HYDROMORPHONE HCL 1 MG/ML IJ SOLN: 0.2500 mg | INTRAMUSCULAR | Status: DC | PRN

## 2017-07-02 MED ORDER — PHENOL 1.4 % MT LIQD: 1.0000 | OROMUCOSAL | Status: DC | PRN

## 2017-07-02 MED ORDER — THROMBIN (RECOMBINANT) 5000 UNITS EX SOLR
OROMUCOSAL | Status: DC | PRN
  Administered 2017-07-02: 5 mL

## 2017-07-02 MED ORDER — ONDANSETRON HCL 4 MG/2ML IJ SOLN
INTRAMUSCULAR | Status: AC
  Filled 2017-07-02: qty 2

## 2017-07-02 MED ORDER — FENTANYL CITRATE (PF) 250 MCG/5ML IJ SOLN
INTRAMUSCULAR | Status: DC | PRN
  Administered 2017-07-02: 50 ug via INTRAVENOUS
  Administered 2017-07-02 (×2): 100 ug via INTRAVENOUS

## 2017-07-02 MED ORDER — MENTHOL 3 MG MT LOZG: 1.0000 | LOZENGE | OROMUCOSAL | Status: DC | PRN

## 2017-07-02 MED ORDER — THROMBIN (RECOMBINANT) 5000 UNITS EX SOLR
CUTANEOUS | Status: AC
  Filled 2017-07-02: qty 10000

## 2017-07-02 MED ORDER — HYDROCODONE-ACETAMINOPHEN 5-325 MG PO TABS
2.0000 | ORAL_TABLET | ORAL | Status: DC | PRN
  Administered 2017-07-03 (×2): 2 via ORAL
  Filled 2017-07-02 (×2): qty 2

## 2017-07-02 MED ORDER — FENTANYL CITRATE (PF) 250 MCG/5ML IJ SOLN
INTRAMUSCULAR | Status: AC
  Filled 2017-07-02: qty 5

## 2017-07-02 SURGICAL SUPPLY — 51 items
BAG DECANTER FOR FLEXI CONT (MISCELLANEOUS) ×2 IMPLANT
BLADE CLIPPER SURG (BLADE) IMPLANT
BUR ACORN 6.0 (BURR) IMPLANT
BUR MATCHSTICK NEURO 3.0 LAGG (BURR) ×2 IMPLANT
CANISTER SUCT 3000ML PPV (MISCELLANEOUS) ×2 IMPLANT
CARTRIDGE OIL MAESTRO DRILL (MISCELLANEOUS) ×1 IMPLANT
DECANTER SPIKE VIAL GLASS SM (MISCELLANEOUS) ×2 IMPLANT
DERMABOND ADVANCED (GAUZE/BANDAGES/DRESSINGS) ×1
DERMABOND ADVANCED .7 DNX12 (GAUZE/BANDAGES/DRESSINGS) ×1 IMPLANT
DEVICE DISSECT PLASMABLAD 3.0S (MISCELLANEOUS) ×1 IMPLANT
DIFFUSER DRILL AIR PNEUMATIC (MISCELLANEOUS) ×2 IMPLANT
DRAPE HALF SHEET 40X57 (DRAPES) IMPLANT
DRAPE LAPAROTOMY T 102X78X121 (DRAPES) ×2 IMPLANT
DRAPE MICROSCOPE LEICA (MISCELLANEOUS) IMPLANT
DRAPE POUCH INSTRU U-SHP 10X18 (DRAPES) ×2 IMPLANT
DRSG OPSITE POSTOP 4X6 (GAUZE/BANDAGES/DRESSINGS) ×2 IMPLANT
DURAPREP 26ML APPLICATOR (WOUND CARE) ×2 IMPLANT
ELECT REM PT RETURN 9FT ADLT (ELECTROSURGICAL) ×2
ELECTRODE REM PT RTRN 9FT ADLT (ELECTROSURGICAL) ×1 IMPLANT
GAUZE SPONGE 4X4 12PLY STRL (GAUZE/BANDAGES/DRESSINGS) ×2 IMPLANT
GAUZE SPONGE 4X4 16PLY XRAY LF (GAUZE/BANDAGES/DRESSINGS) IMPLANT
GLOVE BIO SURGEON STRL SZ7 (GLOVE) ×2 IMPLANT
GLOVE BIOGEL PI IND STRL 7.5 (GLOVE) ×1 IMPLANT
GLOVE BIOGEL PI IND STRL 8.5 (GLOVE) ×1 IMPLANT
GLOVE BIOGEL PI INDICATOR 7.5 (GLOVE) ×1
GLOVE BIOGEL PI INDICATOR 8.5 (GLOVE) ×1
GLOVE ECLIPSE 8.5 STRL (GLOVE) ×2 IMPLANT
GOWN STRL REUS W/ TWL LRG LVL3 (GOWN DISPOSABLE) IMPLANT
GOWN STRL REUS W/ TWL XL LVL3 (GOWN DISPOSABLE) IMPLANT
GOWN STRL REUS W/TWL 2XL LVL3 (GOWN DISPOSABLE) ×2 IMPLANT
GOWN STRL REUS W/TWL LRG LVL3 (GOWN DISPOSABLE)
GOWN STRL REUS W/TWL XL LVL3 (GOWN DISPOSABLE)
KIT BASIN OR (CUSTOM PROCEDURE TRAY) ×2 IMPLANT
KIT ROOM TURNOVER OR (KITS) ×2 IMPLANT
NEEDLE HYPO 22GX1.5 SAFETY (NEEDLE) ×2 IMPLANT
NEEDLE SPNL 20GX3.5 QUINCKE YW (NEEDLE) IMPLANT
NS IRRIG 1000ML POUR BTL (IV SOLUTION) ×2 IMPLANT
OIL CARTRIDGE MAESTRO DRILL (MISCELLANEOUS) ×2
PACK LAMINECTOMY NEURO (CUSTOM PROCEDURE TRAY) ×2 IMPLANT
PAD ARMBOARD 7.5X6 YLW CONV (MISCELLANEOUS) ×6 IMPLANT
PATTIES SURGICAL .5 X1 (DISPOSABLE) ×2 IMPLANT
PLASMABLADE 3.0S (MISCELLANEOUS) ×2
RUBBERBAND STERILE (MISCELLANEOUS) IMPLANT
SPONGE SURGIFOAM ABS GEL SZ50 (HEMOSTASIS) ×2 IMPLANT
SUT VIC AB 1 CT1 18XBRD ANBCTR (SUTURE) ×1 IMPLANT
SUT VIC AB 1 CT1 8-18 (SUTURE) ×1
SUT VIC AB 2-0 CP2 18 (SUTURE) ×2 IMPLANT
SUT VIC AB 3-0 SH 8-18 (SUTURE) ×2 IMPLANT
TOWEL GREEN STERILE (TOWEL DISPOSABLE) ×2 IMPLANT
TOWEL GREEN STERILE FF (TOWEL DISPOSABLE) ×2 IMPLANT
WATER STERILE IRR 1000ML POUR (IV SOLUTION) ×2 IMPLANT

## 2017-07-02 NOTE — Progress Notes (Signed)
Patient ID: Ralph Byrd, male   DOB: 09/26/1955, 61 y.o.   MRN: [id number] Vital signs are stable Motor function is good Incision is clean and dry Patient feels comfortable Left leg feels better

## 2017-07-02 NOTE — ED Notes (Signed)
Patient transported to MRI 

## 2017-07-02 NOTE — Op Note (Signed)
Date of surgery: 07/02/2017 Preoperative diagnosis: Lumbar spondylolisthesis with left lumbar radiculopathy, facet arthropathy, synovial cyst. Postoperative diagnosis: Same Procedure: Bilateral laminotomies and foraminotomies L4-L5 decompression of the L4 and L5 nerve roots. Surgeon: Kristeen Miss M.D. Anesthesia: Gen. endotracheal Indications: Ralph Byrd is a 61 year old individual who has had significant back and left lower extremity pain for 5 months he's tried efforts at conservative management including injections physical therapy and activity modification and in the last couple of weeks he's only had worsening pain in the buttock and left lower extremity. An MRI had demonstrated degenerative changes at L4-L5 with a grade 1 spondylolisthesis. 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 Been advised regarding the need for surgical intervention. He presented to work today and collapsed which prompted a visit to the emergency room.  Procedure: The patient was brought to the operating room supine on a stretcher. After the smooth induction of general endotracheal anesthesia, he was turned prone. The back prepped with alcohol DuraPrep and draped in a sterile fashion. Midline incision was created and carried down to the lumbar dorsal fascia. L4-L5 was identified positively on the radiograph. Then the left side was opened and a sub-periosteal fashion and dissection was carried out removing the inferior margin lamina of L4 out to and including the mesial facet. The yellow ligament was thickened redundant and this was taken up. As the dissection was taken through the L ligament ultimately identified the common dural tube and the take off of the L5 nerve root. This was noted be severely compromised by a dense attachment of the synovial cyst to the dura. This was carefully dissected and the nerve root  was freed. The dissection was carried cephalad to expose into the region of the L4 foramen. This area was decompressed also. Once the decompression was completed same procedure was carried out on the opposite side. No synovial cyst was encountered there. Hemostasis was then achieved in the soft tissues. The retractor was removed. Lumbar dorsal fascia was closed with 1 Vicryl in 2-0 Vicryl in interrupted fashion. 20 mL of half percent Marcaine was injected into the paraspinous fascia. 3-0 Vicryl was used to close subcuticular skin. Blood loss was estimated at 100 mL. Patient tolerated procedure well.

## 2017-07-02 NOTE — ED Provider Notes (Signed)
Care assumed from [facility], pending evaluation by Dr. Ellene Route. See her note for full HPI and workup.   Dr. Ellene Route saw patient in ED and recommends surgical intervention this evening. Pt to remain NPO, and schedule for OR this evening. Pt aware and agreeable to plan.   Ralph Byrd, Ralph N, PA-C 07/02/17 Lance Bosch, MD 07/03/17 1026

## 2017-07-02 NOTE — ED Triage Notes (Signed)
Pt states he has had lower back pain for several months.Pain has been tolerable but today he was walking into work and pain became unbearable. He reports pain radiating down his left leg as well.

## 2017-07-02 NOTE — Anesthesia Postprocedure Evaluation (Signed)
Anesthesia Post Note  Patient: JOWAN SKILLIN  Procedure(s) Performed: BILATERAL LAMINOTOMY L 4-5 (Bilateral Back)     Patient location during evaluation: PACU Anesthesia Type: General Level of consciousness: awake and alert Pain management: pain level controlled Vital Signs Assessment: post-procedure vital signs reviewed and stable Respiratory status: spontaneous breathing, nonlabored ventilation and respiratory function stable Cardiovascular status: blood pressure returned to baseline and stable Postop Assessment: no apparent nausea or vomiting Anesthetic complications: no    Last Vitals:  Vitals:   07/02/17 2300 07/02/17 2315  BP: 127/83 127/88  Pulse:    Resp:    Temp: (!) 36.4 C   SpO2:      Last Pain:  Vitals:   07/02/17 2300  TempSrc:   PainSc: 0-No pain                 Myeisha Kruser,W. EDMOND

## 2017-07-02 NOTE — Anesthesia Procedure Notes (Signed)
Procedure Name: Intubation Date/Time: 07/02/2017 8:28 PM Performed by: Claris Che, CRNA Pre-anesthesia Checklist: Patient identified, Emergency Drugs available, Suction available, Patient being monitored and Timeout performed Patient Re-evaluated:Patient Re-evaluated prior to induction Oxygen Delivery Method: Circle system utilized Preoxygenation: Pre-oxygenation with 100% oxygen Induction Type: IV induction and Cricoid Pressure applied Ventilation: Mask ventilation without difficulty Laryngoscope Size: Mac and 3 Grade View: Grade III Tube type: Oral Tube size: 7.5 mm Number of attempts: 1 Airway Equipment and Method: Stylet Placement Confirmation: ETT inserted through vocal cords under direct vision,  positive ETCO2 and breath sounds checked- equal and bilateral Secured at: 23 cm Tube secured with: Tape Dental Injury: Teeth and Oropharynx as per pre-operative assessment

## 2017-07-02 NOTE — Transfer of Care (Signed)
Immediate Anesthesia Transfer of Care Note  Patient: KEYONTA BARRADAS  Procedure(s) Performed: BILATERAL LAMINOTOMY L 4-5 (Bilateral Back)  Patient Location: PACU  Anesthesia Type:General  Level of Consciousness: sedated, drowsy, patient cooperative and responds to stimulation  Airway & Oxygen Therapy: Patient Spontanous Breathing and Patient connected to nasal cannula oxygen  Post-op Assessment: Report given to RN, Post -op Vital signs reviewed and stable and Patient moving all extremities X 4  Post vital signs: Reviewed and stable  Last Vitals:  Vitals:   07/02/17 1730 07/02/17 1800  BP: 137/83 (!) 146/94  Pulse: 94 100  Resp:    Temp:    SpO2: 94% 97%    Last Pain:  Vitals:   07/02/17 1817  TempSrc:   PainSc: 0-No pain         Complications: No apparent anesthesia complications

## 2017-07-02 NOTE — ED Notes (Signed)
Requested a pharm tech to come due a med req per request of [place] PA. Pharm tech [employer] route.

## 2017-07-02 NOTE — ED Notes (Signed)
Pt returned from MRI °

## 2017-07-02 NOTE — ED Provider Notes (Signed)
[facility] EMERGENCY DEPARTMENT Provider Note   CSN: [id number] Arrival date & time: 07/02/17  0830     History   Chief Complaint Chief Complaint  Patient presents with  . Back Pain    HPI Ralph Byrd is a 61 y.o. male presents the emergency department chief complaint of back pain and leg pain.  He has a past medical history of prostate cancer in remission since 2018, hypertension.  He has had ongoing problems with sciatica and has been followed at [facility].  Patient states that his symptoms began in April and have been constant since that time with little improvement after multiple steroid injections and physical therapy.  The patient had an MRI in May 2018.  His MRI at that time showed severe spinal stenosis at L4-L5 due to hypertrophy of the ligamentum flavum with spondylolisthesis and a central disc protrusion at L5-S1 on the thecal sac affecting the S1 nerve root.  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  He states that he prior to the last 2 days he has been able to walk however he would have to stop and take rest.  He states that his pain is so severe at this point that he can barely walk 1 or 2 steps even with a cane.  He states that trying to shower today was excruciating and he cried throughout the entire event.  He is only been taking ibuprofen for pain control.  He denies any new numbness or weakness in the leg.  He denies any saddle anesthesia or loss of bowel or bladder control.  He denies fevers, weight loss, soaking night sweats.  HPI  Past Medical History:  Diagnosis Date  . Cancer [facility])    prostate cx, remission in 2012  . Hypertension     There are no active problems to display for this patient.   History reviewed. No pertinent surgical history.     Home Medications    Prior to Admission medications   Medication Sig  Start Date End Date Taking? Authorizing Provider  gabapentin (NEURONTIN) 300 MG capsule Take 300 mg by mouth 3 (three) times daily.    [provider]  traMADol (ULTRAM) 50 MG tablet Take 50 mg by mouth 2 (two) times daily.    [provider]    Family History History reviewed. No pertinent family history.  Social History Social History   Tobacco Use  . Smoking status: Never Smoker  . Smokeless tobacco: Never Used  Substance Use Topics  . Alcohol use: No    Frequency: Never  . Drug use: Not on file     Allergies   Patient has no known allergies.   Review of Systems Review of Systems  Ten systems reviewed and are negative for acute change, except as noted in the HPI.   Physical Exam Updated Vital Signs BP (!) 156/87 (BP Location: Right Arm)   Pulse (!) 106   Temp 98.2 F (36.8 C) (Oral)   Resp 16   SpO2 100%   Physical Exam  Constitutional: He appears well-developed and well-nourished. No distress.  HENT:  Head: Normocephalic and atraumatic.  Eyes: Conjunctivae are normal. No scleral icterus.  Neck: Normal range of motion. Neck supple.  Cardiovascular: Normal rate, regular rhythm and normal heart sounds.  Pulmonary/Chest: Effort normal and breath sounds normal. No respiratory distress.  Abdominal: Soft. There is no tenderness.  Musculoskeletal: He exhibits no edema.  Patient with some weakness with left dorsi and plantar flexion likely secondary to pain per the patient Normal reflexes bilaterally.  Positive straight leg test at 5 degrees.  Neurological: He is alert.  Skin: Skin is warm and dry. He is not diaphoretic.  Psychiatric: His behavior is normal.  Nursing note and vitals reviewed.     ED Treatments / Results  Labs (all labs ordered are listed, but only abnormal results are displayed) Labs Reviewed - No data to display  EKG  EKG Interpretation None       Radiology No results found.  Procedures Procedures (including  critical care time)  Medications Ordered in ED Medications - No data to display   Initial Impression / Assessment and Plan / ED Course  I have reviewed the triage vital signs and the nursing notes.  Pertinent labs & imaging results that were available during my care of the patient were reviewed by me and considered in my medical decision making (see chart for details).  Clinical Course as of Jul 06 1007  Fri Jul 02, 2017  1520 I have reviewed the MRI films with Dr. Ellene Route who has asked for flex-ex lumbar films  [AH]  1806 Dr. Ellene Route to take patient to OR. Patient to remain NPO.  [JR]    Clinical Course User Index [AH] Margarita Mail, PA-C [JR] Robinson, [place] N, PA-C     Final Clinical Impressions(s) / ED Diagnoses   Final diagnoses:  None    ED Discharge Orders    None       Margarita Mail, PA-C 07/05/17 1008    Isla Pence, MD 07/08/17 1531

## 2017-07-02 NOTE — Anesthesia Preprocedure Evaluation (Addendum)
Anesthesia Evaluation  Patient identified by MRN, date of birth, ID band Patient awake    Reviewed: Allergy & Precautions, H&P , NPO status , Patient's Chart, lab work & pertinent test results  Airway Mallampati: I  TM Distance: >3 FB Neck ROM: Full    Dental no notable dental hx. (+) Teeth Intact, Dental Advisory Given   Pulmonary neg pulmonary ROS,    Pulmonary exam normal breath sounds clear to auscultation       Cardiovascular hypertension, Pt. on medications  Rhythm:Regular Rate:Normal     Neuro/Psych negative neurological ROS  negative psych ROS   GI/Hepatic negative GI ROS, Neg liver ROS,   Endo/Other  negative endocrine ROS  Renal/GU negative Renal ROS  negative genitourinary   Musculoskeletal   Abdominal   Peds  Hematology negative hematology ROS (+)   Anesthesia Other Findings   Reproductive/Obstetrics negative OB ROS                            Anesthesia Physical Anesthesia Plan  ASA: II  Anesthesia Plan: General   Post-op Pain Management:    Induction: Intravenous  PONV Risk Score and Plan: 3 and Ondansetron, Dexamethasone and Midazolam  Airway Management Planned: Oral ETT  Additional Equipment:   Intra-op Plan:   Post-operative Plan: Extubation in OR  Informed Consent: I have reviewed the patients History and Physical, chart, labs and discussed the procedure including the risks, benefits and alternatives for the proposed anesthesia with the patient or authorized representative who has indicated his/her understanding and acceptance.   Dental advisory given  Plan Discussed with: CRNA  Anesthesia Plan Comments:         Anesthesia Quick Evaluation

## 2017-07-02 NOTE — H&P (Signed)
Ralph Byrd is an 61 y.o. male.   Chief Complaint: Back and left lower extremity pain weakness weakness [id number] is a 60 year old right-handed individual who tells me that he's been having problems with his back since the springtime. He was seen by Dr. Mayer Camel has undergone some conservative treatment. Dr. Jacelyn Grip is done a couple of injections on him. Tells me that over the past month pain is gotten severely worse.  He is now weak in the left lower extremity. An MRI had been performed in May of this year which demonstrated spondylosis at L4-L5 with some mild degree of stenosis. A repeat MRI is performed today and it demonstrates a the patient now has a large synovial cyst on the left side with significant foraminal stenosis and markedly dysplastic facet joints bilaterally. Been advised regarding the need for surgery. He is now admitted for surgery.   Past Medical History:  Diagnosis Date  . Cancer [facility])    prostate cx, remission in 2012  . Hypertension     History reviewed. No pertinent surgical history.  History reviewed. No pertinent family history. Social History:  reports that  has never smoked. he has never used smokeless tobacco. He reports that he does not drink alcohol. His drug history is not on file.  Allergies: No Known Allergies   (Not in a hospital admission)  Results for orders placed or performed during the hospital encounter of 07/02/17 (from the past 48 hour(s))  I-stat chem 8, ed     Status: Abnormal   Collection Time: 07/02/17 10:55 AM  Result Value Ref Range   Sodium 141 135 - 145 mmol/L   Potassium 3.5 3.5 - 5.1 mmol/L   Chloride 103 101 - 111 mmol/L   BUN 22 (H) 6 - 20 mg/dL   Creatinine, Ser 1.20 0.61 - 1.24 mg/dL   Glucose, Bld 97 65 - 99 mg/dL   Calcium, Ion 1.18 1.15 - 1.40 mmol/L   TCO2 24 22 - 32 mmol/L   Hemoglobin 15.3 13.0 - 17.0 g/dL   HCT 45.0 39.0 - 52.0 %   Dg Lumbar Spine Flex & Extend Only  Result Date: 07/02/2017 CLINICAL DATA:  Sciatica.   Back pain. EXAM: LUMBAR SPINE FLEX AND EXTEND ONLY - 2-3 VIEW COMPARISON:  MRI 07/02/2017. FINDINGS: Lumbar spine numbered with the lowest segmented appearing lumbar shaped vertebra as L5. Diffuse multilevel degenerative change. 6 mm anterolisthesis L4 on L5. No evidence of fracture dislocation. No flexion or extension deformity noted. IMPRESSION: Diffuse multilevel degenerative change. 6 mm anterolisthesis L4 on L5. No flexion or extension deformity noted. Electronically Signed   By: Marcello Moores  Register   On: 07/02/2017 16:03   [name]  Result Date: 07/02/2017 CLINICAL DATA:  Low back pain over the last several months, worsening recently. Pain extends to the left leg. EXAM: MRI LUMBAR SPINE WITHOUT CONTRAST TECHNIQUE: Multiplanar, multisequence MR imaging of the lumbar spine was performed. No intravenous contrast was administered. COMPARISON:  11/23/2016 FINDINGS: Segmentation:  5 lumbar type vertebral bodies. Alignment: Minimal curvature convex to the left with the apex at L3-4. 2 mm anterolisthesis L4-5. Vertebrae:  Normal Conus medullaris and cauda equina: Conus extends to the L1 level. Conus and cauda equina appear normal. Paraspinal and other soft tissues: Negative Disc levels: No abnormality at L2-3 or above. The discs are normal. The canal and foramina are widely patent. The distal cord and conus are normal. L3-4: Mild desiccation and circumferential bulging of the disc. Mild facet and  ligamentous hypertrophy. No compressive stenosis. L4-5: Bilateral facet arthropathy with gaping, fluid-filled facet joints. 2 mm of anterolisthesis that would likely worsen with standing or flexion. Circumferential protrusion of the disc. Synovial cysts associated with both facet joints. Severe stenosis at this level because of these processes quite likely to cause neural compression. L5-S1: Central disc herniation approaches the thecal sac and S1 nerves but does not cause visible neural  compression. Mild facet osteoarthritis at this level. IMPRESSION: L4-5: Advanced bilateral facet arthropathy with gaping, fluid-filled facet joints. 2 mm anterolisthesis that would likely worsen with standing or flexion. Synovial cysts. Severe multifactorial stenosis at this level, worsened since the study of May. L5-S1 central disc herniation without apparent neural compression. This could contribute to back pain. L3-4 disc bulge and facet hypertrophy without compressive stenosis. Electronically Signed   By: Nelson Chimes M.D.   On: 07/02/2017 14:56    Review of Systems  Constitutional: Negative.   HENT: Negative.   Eyes: Negative.   Respiratory: Negative.   Cardiovascular: Negative.   Gastrointestinal: Negative.   Genitourinary: Negative.   Musculoskeletal: Positive for back pain.  Skin: Negative.   Neurological: Positive for tingling and focal weakness.  Endo/Heme/Allergies: Negative.   Psychiatric/Behavioral: Negative.     Blood pressure (!) 148/90, pulse 94, temperature 98.2 F (36.8 C), temperature source Oral, resp. rate 16, SpO2 95 %. Physical Exam  Constitutional: He is oriented to person, place, and time. He appears well-developed and well-nourished.  HENT:  Head: Normocephalic and atraumatic.  Eyes: Conjunctivae and EOM are normal. Pupils are equal, round, and reactive to light.  Neck: Normal range of motion. Neck supple.  GI: Soft. Bowel sounds are normal.  Musculoskeletal:  Moderate paravertebral spasms to palpation and percussion. Positive straight leg raising on the right. Positive straight leg raising on the left. Patrick's maneuver is negative bilaterally. Weakness in tibialis anterior on the left 4 out of 5 compared to the right side. Tone and bulk are intact. Deep tendon reflexes are absent in the patellae and the Achilles both.weakness in tibialis anterior on the left 4 out of 5 compared to the right side.  Tone and bulk are intact.  Deep tendon reflexes are absent in  the patella and the Achilles both.  Positive straight leg raising on the left.  Patrick's maneuver is negative bilaterally.  Moderate paravertebral spasm to palpation and percussion.  Positive straight leg raising on the right.  Neurological: He is alert and oriented to person, place, and time.  Renal nerve examination is normal.Cranial nerve examination is normal. Station is normal gait is antalgic on the left side.  Station is normal gait is antalgic on the left side.  Skin: Skin is dry.  Psychiatric: He has a normal mood and affect. His behavior is normal. Judgment and thought content normal.     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  Admit for surgical decompression bilateral laminotomies L4-L5   Earleen Newport, MD 07/02/2017, 6:07 PM

## 2017-07-02 NOTE — ED Notes (Signed)
Patient transported to X-ray 

## 2017-07-03 ENCOUNTER — Encounter (HOSPITAL_COMMUNITY): Payer: Self-pay | Admitting: Neurological Surgery

## 2017-07-03 DIAGNOSIS — M48061 Spinal stenosis, lumbar region without neurogenic claudication: Secondary | ICD-10-CM | POA: Diagnosis not present

## 2017-07-03 DIAGNOSIS — Z8546 Personal history of malignant neoplasm of prostate: Secondary | ICD-10-CM | POA: Diagnosis not present

## 2017-07-03 DIAGNOSIS — M7138 Other bursal cyst, other site: Secondary | ICD-10-CM | POA: Diagnosis not present

## 2017-07-03 DIAGNOSIS — M1288 Other specific arthropathies, not elsewhere classified, other specified site: Secondary | ICD-10-CM | POA: Diagnosis not present

## 2017-07-03 DIAGNOSIS — Z7982 Long term (current) use of aspirin: Secondary | ICD-10-CM | POA: Diagnosis not present

## 2017-07-03 DIAGNOSIS — M5116 Intervertebral disc disorders with radiculopathy, lumbar region: Secondary | ICD-10-CM | POA: Diagnosis not present

## 2017-07-03 DIAGNOSIS — I10 Essential (primary) hypertension: Secondary | ICD-10-CM | POA: Diagnosis not present

## 2017-07-03 DIAGNOSIS — M4316 Spondylolisthesis, lumbar region: Secondary | ICD-10-CM | POA: Diagnosis not present

## 2017-07-03 DIAGNOSIS — M4696 Unspecified inflammatory spondylopathy, lumbar region: Secondary | ICD-10-CM | POA: Diagnosis not present

## 2017-07-03 MED ORDER — HYDROCODONE-ACETAMINOPHEN 5-325 MG PO TABS: 1.0000 | ORAL_TABLET | ORAL | 0 refills | Status: DC | PRN

## 2017-07-03 MED ORDER — METHOCARBAMOL 500 MG PO TABS: 500.0000 mg | ORAL_TABLET | Freq: Four times a day (QID) | ORAL | 3 refills | Status: DC | PRN

## 2017-07-03 NOTE — Progress Notes (Signed)
Patient alert and oriented, mae's well, voiding adequate amount of urine, swallowing without difficulty, no c/o pain at time of discharge. Patient discharged home with family. Script and discharged instructions given to patient. Patient and family stated understanding of instructions given. Patient has an appointment with Dr. Elsner  

## 2017-07-03 NOTE — Discharge Summary (Signed)
Physician Discharge Summary  Patient ID: Ralph Byrd MRN: [id number] DOB/AGE: Sep 06, 1955 61 y.o.  Admit date: 07/02/2017 Discharge date: 07/03/2017  Admission Diagnoses: Spondylolisthesis L4-L5 with lumbar radiculopathy, facet arthropathy L4-L5 with synovial cyst on left  Discharge Diagnoses: Spondylolisthesis L4-L5 with lumbar radiculopathy. Facet arthropathy L4-L5 with synovial cyst on left.  Active Problems:   Lumbar radiculopathy, chronic   Discharged Condition: good  Hospital Course: Patient was admitted on go evaluation for back and left lower extremity pain. He was found to have a spondylolisthesis at L4-L5 on a recent MRI and was noted that he had a large synovial cyst on the left side at that level. Patient was incapacitated with pain he underwent surgical decompression which improved his symptoms substantially. Is discharged home  Consults: None  Significant Diagnostic Studies: None  Treatments: surgery: Bilateral laminotomies and foraminotomies L4-L5  Discharge Exam: Blood pressure 121/76, pulse 98, temperature 99.4 F (37.4 C), resp. rate 16, height 5\' 9"  (1.753 m), weight 88.5 kg (195 lb), SpO2 96 %. Incision is clean and dry motor function is intact. Station and gait are intact.  Disposition: Discharge home   Discharge Instructions    Call MD for:  redness, tenderness, or signs of infection (pain, swelling, redness, odor or green/yellow discharge around incision site)   Complete by:  As directed    Call MD for:  severe uncontrolled pain   Complete by:  As directed    Call MD for:  temperature >100.4   Complete by:  As directed    Diet - low sodium heart healthy   Complete by:  As directed    Discharge instructions   Complete by:  As directed    Okay to shower. Do not apply salves or appointments to incision. No heavy lifting with the upper extremities greater than 15 pounds. May resume driving when not requiring pain medication and patient feels comfortable  with doing so.   Incentive spirometry RT   Complete by:  As directed    Increase activity slowly   Complete by:  As directed      Allergies as of 07/03/2017   No Known Allergies     Medication List    TAKE these medications   amLODipine 5 MG tablet Commonly known as:  NORVASC Take 5 mg by mouth daily.   ANDROGEL PUMP 20.25 MG/ACT (1.62%) Gel Generic drug:  Testosterone Place 2 Pump onto the skin daily.   aspirin 325 MG EC tablet Take 325 mg by mouth daily.   clopidogrel 75 MG tablet Commonly known as:  PLAVIX Take 75 mg by mouth daily.   gabapentin 300 MG capsule Commonly known as:  NEURONTIN Take 300 mg by mouth 3 (three) times daily.   hydrochlorothiazide 25 MG tablet Commonly known as:  HYDRODIURIL Take 25 mg by mouth daily.   HYDROcodone-acetaminophen 5-325 MG tablet Commonly known as:  NORCO/VICODIN Take 1-2 tablets by mouth every 4 (four) hours as needed for severe pain ((score 7 to 10)).   ibuprofen 200 MG tablet Commonly known as:  ADVIL,MOTRIN Take 400 mg by mouth every 6 (six) hours as needed for moderate pain.   methocarbamol 500 MG tablet Commonly known as:  ROBAXIN Take 1 tablet (500 mg total) by mouth every 6 (six) hours as needed for muscle spasms.   multivitamin with minerals Tabs tablet Take 1 tablet by mouth daily.   pravastatin 20 MG tablet Commonly known as:  PRAVACHOL Take 20 mg by mouth daily.   traMADol 50 MG  tablet Commonly known as:  ULTRAM Take 50 mg by mouth 2 (two) times daily.        SignedEarleen Newport 07/03/2017, 8:23 AM

## 2017-07-03 NOTE — Progress Notes (Signed)
OT Screen    07/03/17 0900  OT Visit Information  Last OT Received On 07/03/17  Reason Eval/Treat Not Completed OT screened, no needs identified, will sign off. Pt performing near baseline function and fully dressed and moving in room independently. Pt stating he understands all back precautions and education. Planning for dc later today.   [place], OTR/L Acute Rehab Pager: [phone] Office: [phone]

## 2017-07-03 NOTE — Evaluation (Signed)
Physical Therapy Evaluation Patient Details Name: Ralph Byrd MRN: [id number] DOB: August 24, 1955 Today's Date: 07/03/2017   History of Present Illness  Spondylolisthesis L4-L5 with lumbar radiculopathy, facet arthropathy L4-L5 with synovial cyst on left. underwent surgery on 12/21. PMH significant for prostate cancer  Clinical Impression  Patient is s/p above surgery resulting. Patient benefitted from 1 session of skilled PT to increase their independence and safety with mobility (while adhering to their precautions) to allow discharge home. I have encouraged the patient to gradually increase activity daily to tolerance.  I have answered all patient's question regarding PT and mobility.   Pt feels ready for DC home today.       Follow Up Recommendations No PT follow up;Supervision - Intermittent    Equipment Recommendations  None recommended by PT    Recommendations for Other Services       Precautions / Restrictions Precautions Precautions: Back Precaution Booklet Issued: Yes (comment)      Mobility  Bed Mobility Overal bed mobility: (NT, pt reports no difficulty and verbalizes how to log roll)                Transfers Overall transfer level: Independent Equipment used: None             General transfer comment: no difficulty with transfers  Ambulation/Gait Ambulation/Gait assistance: Independent Ambulation Distance (Feet): 250 Feet Assistive device: None Gait Pattern/deviations: Step-through pattern     General Gait Details: no balance losses  Stairs Stairs: Yes Stairs assistance: Min guard Stair Management: One rail Right;Alternating pattern;Forwards Number of Stairs: 8 General stair comments: instructed in up wtih the stronger leg first and down with weaker leg first. Pt preferred alternating patterna nd could verbalize safest technique.   Wheelchair Mobility    Modified Rankin (Stroke Patients Only)       Balance Overall balance  assessment: No apparent balance deficits (not formally assessed)                                           Pertinent Vitals/Pain Pain Assessment: 0-10 Pain Score: 2  Pain Location: left leg, posterior thigh and low back Pain Intervention(s): Limited activity within patient's tolerance;Monitored during session    [place] expects to be discharged to:: Private residence Living Arrangements: Spouse/significant other Available Help at Discharge: Family;Available 24 hours/day Type of Home: Apartment Home Access: Stairs to enter Entrance Stairs-Rails: Psychiatric nurse of Steps: 12 Home Layout: One level Home Equipment: Cane - quad      Prior Function Level of Independence: Independent with assistive device(s)         Comments: Activity weas limited most recently due to severe back pain     Hand Dominance        Extremity/Trunk Assessment   Upper Extremity Assessment Upper Extremity Assessment: Overall WFL for tasks assessed    Lower Extremity Assessment Lower Extremity Assessment: Overall WFL for tasks assessed    Cervical / Trunk Assessment Cervical / Trunk Assessment: Normal  Communication   Communication: No difficulties  Cognition Arousal/Alertness: Awake/alert Behavior During Therapy: WFL for tasks assessed/performed Overall Cognitive Status: Within Functional Limits for tasks assessed                                        General  Comments General comments (skin integrity, edema, etc.): no family present. Pt able to teach back all instructions regarding mobility and safety. Pt reports no difficulty with dressing and discussed shower transfers and safety.     Exercises     Assessment/Plan    PT Assessment Patent does not need any further PT services  PT Problem List         PT Treatment Interventions      PT Goals (Current goals can be found in the Care Plan section)  Acute Rehab  PT Goals Patient Stated Goal: to go home today    Frequency     Barriers to discharge        Co-evaluation               AM-PAC PT "6 Clicks" Daily Activity  Outcome Measure Difficulty turning over in bed (including adjusting bedclothes, sheets and blankets)?: None Difficulty moving from lying on back to sitting on the side of the bed? : A Little Difficulty sitting down on and standing up from a chair with arms (e.g., wheelchair, bedside commode, etc,.)?: None Help needed moving to and from a bed to chair (including a wheelchair)?: None Help needed walking in hospital room?: None Help needed climbing 3-5 steps with a railing? : A Little 6 Click Score: 22    End of Session Equipment Utilized During Treatment: Gait belt Activity Tolerance: Patient tolerated treatment well Patient left: in chair;with call bell/phone within reach Nurse Communication: Mobility status PT Visit Diagnosis: Difficulty in walking, not elsewhere classified (R26.2)    Time: 1914-7829 PT Time Calculation (min) (ACUTE ONLY): 22 min   Charges:   PT Evaluation $PT Eval Low Complexity: 1 Low     PT G Codes:   PT G-Codes **NOT FOR INPATIENT CLASS** Functional Assessment Tool Used: AM-PAC 6 Clicks Basic Mobility Functional Limitation: Mobility: Walking and moving around Mobility: Walking and Moving Around Current Status (F6213): At least 20 percent but less than 40 percent impaired, limited or restricted Mobility: Walking and Moving Around Goal Status [phone]): At least 20 percent but less than 40 percent impaired, limited or restricted Mobility: Walking and Moving Around Discharge Status [phone]): At least 20 percent but less than 40 percent impaired, limited or restricted    Lavonia Dana, PT  [phone] 07/03/2017   Melvern Banker 07/03/2017, 9:11 AM

## 2017-07-09 MED FILL — GABAPENTIN 300 MG CAPSULE: 300 | 30 days supply | Qty: 90 | Fill #0

## 2017-07-09 MED FILL — traMADol HCL 50 MG TABS: 50 | 30 days supply | Qty: 90 | Fill #0

## 2017-07-15 MED FILL — HYDROCODON-APAP 5-325: 5-325 | 10 days supply | Qty: 40 | Fill #0

## 2017-07-21 MED FILL — MELOXICAM 15 MG TABLET: 15 | 30 days supply | Qty: 30 | Fill #0

## 2017-07-29 MED FILL — TESTOSTERONE 20.25 MG/ACT (: 20.25 MG/AC | 30 days supply | Qty: 75 | Fill #2

## 2017-08-05 DIAGNOSIS — Z8546 Personal history of malignant neoplasm of prostate: Secondary | ICD-10-CM | POA: Diagnosis not present

## 2017-08-05 MED FILL — PRAVASTATIN SODIUM 20 MG TA: 20 | 90 days supply | Qty: 90 | Fill #3

## 2017-08-12 DIAGNOSIS — N5201 Erectile dysfunction due to arterial insufficiency: Secondary | ICD-10-CM | POA: Diagnosis not present

## 2017-08-12 DIAGNOSIS — E349 Endocrine disorder, unspecified: Secondary | ICD-10-CM | POA: Diagnosis not present

## 2017-08-12 DIAGNOSIS — C61 Malignant neoplasm of prostate: Secondary | ICD-10-CM | POA: Diagnosis not present

## 2017-08-16 MED FILL — MELOXICAM 15 MG TABLET: 15 | 30 days supply | Qty: 30 | Fill #1

## 2017-08-30 DIAGNOSIS — E291 Testicular hypofunction: Secondary | ICD-10-CM | POA: Diagnosis not present

## 2017-08-30 MED FILL — TESTOSTERONE 20.25 MG/ACT (: 20.25 MG/AC | 30 days supply | Qty: 75 | Fill #3

## 2017-09-13 MED FILL — HYDROCHLOROTHIAZIDE 25 MG T: 25 | 90 days supply | Qty: 90 | Fill #3

## 2017-09-13 MED FILL — MELOXICAM 15 MG TABLET: 15 | 30 days supply | Qty: 30 | Fill #2

## 2017-09-30 MED FILL — TESTOSTERONE 20.25 MG/ACT (: 20.25 MG/AC | 30 days supply | Qty: 75 | Fill #4

## 2017-10-01 MED FILL — AMLODIPINE BESYLATE 5 MG TA: 5 | 90 days supply | Qty: 90 | Fill #3

## 2017-10-01 MED FILL — CLOPIDOGREL 75 MG TABLET: 75 | 90 days supply | Qty: 90 | Fill #3

## 2017-11-01 MED FILL — TESTOSTERONE 20.25 MG/ACT (: 20.25 MG/AC | 30 days supply | Qty: 75 | Fill #5

## 2017-11-01 MED FILL — PRAVASTATIN SODIUM 20 MG TA: 20 | 90 days supply | Qty: 90 | Fill #0

## 2017-11-03 DIAGNOSIS — N529 Male erectile dysfunction, unspecified: Secondary | ICD-10-CM | POA: Diagnosis not present

## 2017-11-03 DIAGNOSIS — Z8546 Personal history of malignant neoplasm of prostate: Secondary | ICD-10-CM | POA: Diagnosis not present

## 2017-11-03 DIAGNOSIS — I10 Essential (primary) hypertension: Secondary | ICD-10-CM | POA: Diagnosis not present

## 2017-11-03 DIAGNOSIS — R7301 Impaired fasting glucose: Secondary | ICD-10-CM | POA: Diagnosis not present

## 2017-11-03 DIAGNOSIS — E291 Testicular hypofunction: Secondary | ICD-10-CM | POA: Diagnosis not present

## 2017-11-03 DIAGNOSIS — Z8673 Personal history of transient ischemic attack (TIA), and cerebral infarction without residual deficits: Secondary | ICD-10-CM | POA: Diagnosis not present

## 2017-11-03 DIAGNOSIS — E7800 Pure hypercholesterolemia, unspecified: Secondary | ICD-10-CM | POA: Diagnosis not present

## 2017-12-02 MED FILL — TESTOSTERONE 20.25 MG/ACT (: 20.25 MG/AC | 30 days supply | Qty: 75 | Fill #0

## 2017-12-09 MED FILL — HYDROCHLOROTHIAZIDE 25 MG T: 25 | 90 days supply | Qty: 90 | Fill #0

## 2017-12-30 MED FILL — AMLODIPINE BESYLATE 5 MG TA: 5 | 90 days supply | Qty: 90 | Fill #0

## 2017-12-30 MED FILL — CLOPIDOGREL 75 MG TABLET: 75 | 90 days supply | Qty: 90 | Fill #0

## 2017-12-30 MED FILL — TESTOSTERONE 20.25 MG/ACT (: 20.25 MG/AC | 30 days supply | Qty: 75 | Fill #1

## 2018-01-25 MED FILL — PRAVASTATIN SODIUM 20 MG TA: 20 | 90 days supply | Qty: 90 | Fill #0

## 2018-02-01 MED FILL — TESTOSTERONE 20.25 MG/ACT (: 20.25 MG/AC | 30 days supply | Qty: 75 | Fill #2

## 2018-02-04 MED FILL — traMADol HCL 50 MG TABS: 50 | 15 days supply | Qty: 30 | Fill #0

## 2018-02-04 MED FILL — GABAPENTIN 300 MG CAPSULE: 300 | 90 days supply | Qty: 180 | Fill #0

## 2018-03-02 MED FILL — TESTOSTERONE 20.25 MG/ACT (: 20.25 MG/AC | 30 days supply | Qty: 75 | Fill #3

## 2018-03-11 MED FILL — HYDROCHLOROTHIAZIDE 25 MG T: 25 | 90 days supply | Qty: 90 | Fill #1

## 2018-03-31 MED FILL — CLOPIDOGREL 75 MG TABLET: 75 | 90 days supply | Qty: 90 | Fill #1

## 2018-03-31 MED FILL — AMLODIPINE BESYLATE 5 MG TA: 5 | 90 days supply | Qty: 90 | Fill #1

## 2018-04-05 MED FILL — TESTOSTERONE 20.25 MG/ACT (: 20.25 MG/AC | 30 days supply | Qty: 75 | Fill #4

## 2018-04-27 MED FILL — PRAVASTATIN SODIUM 20 MG TA: 20 | 90 days supply | Qty: 90 | Fill #1

## 2018-05-03 MED FILL — TESTOSTERONE 20.25 MG/ACT (: 20.25 MG/AC | 30 days supply | Qty: 75 | Fill #5

## 2018-05-09 DIAGNOSIS — M7918 Myalgia, other site: Secondary | ICD-10-CM | POA: Diagnosis not present

## 2018-05-09 DIAGNOSIS — Z0000 Encounter for general adult medical examination without abnormal findings: Secondary | ICD-10-CM | POA: Diagnosis not present

## 2018-05-09 DIAGNOSIS — Z8546 Personal history of malignant neoplasm of prostate: Secondary | ICD-10-CM | POA: Diagnosis not present

## 2018-05-09 DIAGNOSIS — I10 Essential (primary) hypertension: Secondary | ICD-10-CM | POA: Diagnosis not present

## 2018-05-09 DIAGNOSIS — E291 Testicular hypofunction: Secondary | ICD-10-CM | POA: Diagnosis not present

## 2018-05-09 DIAGNOSIS — N529 Male erectile dysfunction, unspecified: Secondary | ICD-10-CM | POA: Diagnosis not present

## 2018-05-09 DIAGNOSIS — Z8673 Personal history of transient ischemic attack (TIA), and cerebral infarction without residual deficits: Secondary | ICD-10-CM | POA: Diagnosis not present

## 2018-05-09 DIAGNOSIS — R7301 Impaired fasting glucose: Secondary | ICD-10-CM | POA: Diagnosis not present

## 2018-05-09 DIAGNOSIS — E7800 Pure hypercholesterolemia, unspecified: Secondary | ICD-10-CM | POA: Diagnosis not present

## 2018-05-09 MED FILL — GABAPENTIN 300 MG CAPSULE: 300 | 90 days supply | Qty: 180 | Fill #0

## 2018-05-10 MED FILL — traMADol HCL 50 MG TABS: 50 | 45 days supply | Qty: 90 | Fill #0

## 2018-06-08 MED FILL — HYDROCHLOROTHIAZIDE 25 MG T: 25 | 90 days supply | Qty: 90 | Fill #2

## 2018-06-08 MED FILL — MELOXICAM 15 MG TABLET: 15 | 30 days supply | Qty: 30 | Fill #0

## 2018-06-28 MED FILL — AMLODIPINE BESYLATE 5 MG TA: 5 | 90 days supply | Qty: 90 | Fill #2

## 2018-06-28 MED FILL — CLOPIDOGREL 75 MG TABLET: 75 | 90 days supply | Qty: 90 | Fill #2

## 2018-07-11 ENCOUNTER — Ambulatory Visit: Payer: PRIVATE HEALTH INSURANCE | Attending: Neurological Surgery

## 2018-07-11 ENCOUNTER — Other Ambulatory Visit: Payer: Self-pay

## 2018-07-11 DIAGNOSIS — M5441 Lumbago with sciatica, right side: Secondary | ICD-10-CM | POA: Diagnosis present

## 2018-07-11 DIAGNOSIS — M5442 Lumbago with sciatica, left side: Secondary | ICD-10-CM | POA: Insufficient documentation

## 2018-07-11 DIAGNOSIS — G8929 Other chronic pain: Secondary | ICD-10-CM | POA: Diagnosis present

## 2018-07-11 DIAGNOSIS — M6281 Muscle weakness (generalized): Secondary | ICD-10-CM | POA: Insufficient documentation

## 2018-07-11 DIAGNOSIS — M62830 Muscle spasm of back: Secondary | ICD-10-CM | POA: Insufficient documentation

## 2018-07-11 DIAGNOSIS — M4316 Spondylolisthesis, lumbar region: Secondary | ICD-10-CM | POA: Diagnosis present

## 2018-07-11 NOTE — Therapy (Signed)
[place], [place], [address] Phone: [phone]   Fax:  [phone]  Physical Therapy Evaluation  Patient Details  Name: Ralph Byrd MRN: [id number] Date of Birth: 1955-12-16 Referring Provider (PT): Kristeen Miss, MD   Encounter Date: 07/11/2018  PT End of Session - 07/11/18 0956    Visit Number  1    Number of Visits  16    Date for PT Re-Evaluation  09/02/18    Authorization Type  WC    Authorization - Visit Number  1    Authorization - Number of Visits  16    PT Start Time  1008    PT Stop Time  1100    PT Time Calculation (min)  52 min    Activity Tolerance  Patient limited by pain    Behavior During Therapy  [facility] for tasks assessed/performed       Past Medical History:  Diagnosis Date  . Cancer [facility])    prostate cx, remission in 2012  . Hypertension     Past Surgical History:  Procedure Laterality Date  . LUMBAR LAMINECTOMY/DECOMPRESSION MICRODISCECTOMY Bilateral 07/02/2017   Procedure: BILATERAL LAMINOTOMY L 4-5;  Surgeon: Kristeen Miss, MD;  Location: [place];  Service: Neurosurgery;  Laterality: Bilateral;    There were no vitals filed for this visit.   Subjective Assessment - 07/11/18 1009    Subjective  He reports LBP and leg pain. He reports pain not improved . Pre surgery only LT leg now  in both legs.  Surgeon took x rays and has MRI but has not talked to MD yet. ..  Said with bending he has  movement in spine.  ( spondylolisthesis).   He returned to work but pain continued .   His meds help only short times    Pertinent History  Lumbar surgery  06/2017.       Limitations  Walking   all activity and positions   How long can you sit comfortably?  20 min max    How long can you stand comfortably?  10 min    How long can you walk comfortably?  200 yards max    Diagnostic tests  Xrays: movement in spine    Patient Stated Goals  He is not sure.     Currently in Pain?  Yes    Pain Score  9     Pain Location  Back    Pain Orientation  Right;Left;Posterior;Lower    Pain Descriptors / Indicators  Sharp;Constant    Pain Type  Chronic pain    Pain Radiating Towards  He reports buttock to posterior thigh and calf and LT  groin to post thigh to calf.     Pain Onset  More than a month ago    Pain Frequency  Constant    Aggravating Factors   All activity    Pain Relieving Factors  bend over.   , medication.          [facility] PT Assessment - 07/11/18 0001      Assessment   Medical Diagnosis  spondylolisthesis    Referring Provider (PT)  Kristeen Miss, MD    Onset Date/Surgical Date  --   2018   Next MD Visit  07/20/18    Prior Therapy  No PT post surgery      Balance Screen   Has the patient fallen in the past 6 months  No    Has the patient  had a decrease in activity level because of a fear of falling?   Yes      [place]  Private residence    Living Arrangements  Spouse/significant other    Type of Impact to enter    Entrance Stairs-Number of Steps  15    Entrance Stairs-Rails  Can reach both    [place]  One level      Prior Function   Level of [place] device for independence;Needs assistance with homemaking;Needs assistance with ADLs    Vocation  Unemployed    Vocation Requirements  Previous job in [place]   Overall Cognitive Status  Within Functional Limits for tasks assessed      Observation/Other Assessments   Focus on Therapeutic Outcomes (FOTO)   75% limited   improvement to 61% limited per FOTO     Posture/Postural Control   Posture Comments  flexed trunk in standing. or he flexes knees and hips       ROM / Strength   AROM / PROM / Strength  AROM;Strength;PROM      AROM   Overall AROM Comments  after flex and ext he sat down so measurement stopped    AROM Assessment Site  Lumbar    Lumbar Flexion  20    Lumbar Extension  5    Lumbar  - Right Rotation  15    Lumbar - Left Rotation  0      PROM   Overall PROM Comments  hip ROM appears WNL but caused pain      Strength   Overall Strength Comments  he would not sustain any testing in either leg.       Palpation   Palpation comment  tender across lower back      Ambulation/Gait   Gait Comments  SPC flexed decr weight RT.                 Objective measurements completed on examination: See above findings.              PT Education - 07/11/18 1105    Education Details  POC , HEP    Person(s) Educated  Patient    Methods  Explanation;Tactile cues;Verbal cues    Comprehension  Returned demonstration;Verbalized understanding       PT Short Term Goals - 07/11/18 1025      PT SHORT TERM GOAL #1   Title  pt to be I with inital HEP     Time  3    Period  Weeks    Status  New      PT SHORT TERM GOAL #2   Title  He will be able to sit for 20 min without incr pain    Time  3    Period  Weeks    Status  New      PT SHORT TERM GOAL #3   Title  pt to reduce low back muscle tightness and guarding to decrease pain to </= 5/10 and promote trunk mobility for therapuetic progression (01/26/2017)    Time  4    Period  Weeks    Status  New      PT SHORT TERM GOAL #4   Title  He will report able to walk 200 yards without increased back pain    Time  4  Period  Weeks    Status  New        PT Long Term Goals - 07/11/18 1026      PT LONG TERM GOAL #1   Title  He will report pain decr  to 3/10 generally and able to walk without device    Time  8    Period  Weeks    Status  New      PT LONG TERM GOAL #2   Title  He will be able to sit for 30 min with support with no incr pain    Time  8    Period  Weeks    Status  New      PT LONG TERM GOAL #3   Title  He will be able to walk 1/4 mile without device with pain no > 3/10    Time  8    Period  Weeks    Status  New      PT LONG TERM GOAL #4   Title  increase FOTO score to </= 41%  limited to demo improvement in function (02/16/17)    Time  8    Period  Weeks    Status  New      PT LONG TERM GOAL #5   Title  pt to be I with all HEP given as of last visit     Time  8    Period  Weeks    Status  New      Additional Long Term Goals   Additional Long Term Goals  Yes      PT LONG TERM GOAL #6   Title  He will report leg symptoms as intermittant    Time  8    Period  Weeks    Status  New             Plan - 07/11/18 0957    Clinical Impression Statement  Ralph Byrd presents with significant pain in back and legs that is constant nad is not improving with rest and medications.    He had surgery a year ago and tried to return to work without success .  He reports all activity is painful . He is somewhat emotional about the lack of improvement in pain since surgery.        Prognosis is guarded as far as significant dcr pain  due to chronicity , progression into both LE, lack of benefit of medication.   Skilled PT should help.     History and Personal Factors relevant to plan of care:  spinal surgery without success.     Clinical Presentation  Unstable    Clinical Presentation due to:  chronic  LBP , unable to work,  unable to do normal home tasks , needs assist with self care. Due to spasm, radicular symptoms    Clinical Decision Making  Moderate    Rehab Potential  Good    Clinical Impairments Affecting Rehab Potential  chronicity of pain . constant LE symptoms.     PT Frequency  2x / week    PT Duration  8 weeks    PT Treatment/Interventions  Dry needling;Patient/family education;Therapeutic exercise;Therapeutic activities;Moist Heat;Electrical Stimulation;Manual techniques;Passive range of motion    PT Next Visit Plan  REview HEP issued and past HEP, manual , modalities , strengthen core  flexion based to start    PT Home Exercise Plan  ab set , hip abd/add gentle    Consulted and Agree  with Plan of Care  Patient       Patient will benefit from skilled  therapeutic intervention in order to improve the following deficits and impairments:  Pain, Postural dysfunction, Decreased strength, Decreased activity tolerance, Decreased range of motion, Increased muscle spasms, Difficulty walking  Visit Diagnosis: Muscle spasm of back - Plan: PT plan of care cert/re-cert  Muscle weakness (generalized) - Plan: PT plan of care cert/re-cert  Chronic bilateral low back pain with bilateral sciatica - Plan: PT plan of care cert/re-cert  Spondylolisthesis at L4-L5 level - Plan: PT plan of care cert/re-cert     Problem List Patient Active Problem List   Diagnosis Date Noted  . Lumbar radiculopathy, chronic 07/02/2017    Darrel Hoover  PT  07/11/2018, 11:16 AM  [facility] [address] [place], [place], [address] Phone: [phone]   Fax:  [phone]  Name: Ralph Byrd MRN: [id number] Date of Birth: 1955-07-27

## 2018-07-11 NOTE — Patient Instructions (Signed)
Abdominal sets and  Hip add/abd x 3- 5 reps    4-6 x /day and deep breathing

## 2018-07-19 ENCOUNTER — Ambulatory Visit: Payer: PRIVATE HEALTH INSURANCE | Attending: Neurological Surgery | Admitting: Physical Therapy

## 2018-07-19 ENCOUNTER — Encounter: Payer: Self-pay | Admitting: Physical Therapy

## 2018-07-19 DIAGNOSIS — M62830 Muscle spasm of back: Secondary | ICD-10-CM | POA: Insufficient documentation

## 2018-07-19 DIAGNOSIS — M4316 Spondylolisthesis, lumbar region: Secondary | ICD-10-CM | POA: Diagnosis present

## 2018-07-19 DIAGNOSIS — G8929 Other chronic pain: Secondary | ICD-10-CM | POA: Diagnosis present

## 2018-07-19 DIAGNOSIS — R2689 Other abnormalities of gait and mobility: Secondary | ICD-10-CM | POA: Diagnosis present

## 2018-07-19 DIAGNOSIS — M6281 Muscle weakness (generalized): Secondary | ICD-10-CM | POA: Diagnosis present

## 2018-07-19 DIAGNOSIS — M5442 Lumbago with sciatica, left side: Secondary | ICD-10-CM | POA: Diagnosis present

## 2018-07-19 DIAGNOSIS — M5441 Lumbago with sciatica, right side: Secondary | ICD-10-CM | POA: Diagnosis present

## 2018-07-19 NOTE — Therapy (Addendum)
[place], [place], [address] Phone: [phone]   Fax:  [phone]  Physical Therapy Treatment/Discharge  Patient Details  Name: Ralph Byrd MRN: [id number] Date of Birth: [birth year] Referring Provider (PT): Kristeen Miss, MD   Encounter Date: 07/19/2018  PT End of Session - 07/19/18 1152    Visit Number  2    Number of Visits  16    PT Start Time  1105    PT Stop Time  1200    PT Time Calculation (min)  55 min    Activity Tolerance  Patient limited by pain       Past Medical History:  Diagnosis Date  . Cancer [facility])    prostate cx, remission in 2012  . Hypertension     Past Surgical History:  Procedure Laterality Date  . LUMBAR LAMINECTOMY/DECOMPRESSION MICRODISCECTOMY Bilateral 07/02/2017   Procedure: BILATERAL LAMINOTOMY L 4-5;  Surgeon: Kristeen Miss, MD;  Location: [place];  Service: Neurosurgery;  Laterality: Bilateral;    There were no vitals filed for this visit.  Subjective Assessment - 07/19/18 1102    Subjective  Pt reports pinching on the left lower abdominal area when performing HEP. Pt reports pain across lower back on both sides.    Pain Score  8     Pain Location  Back    Pain Orientation  Left;Right;Lower;Posterior    Pain Descriptors / Indicators  Sharp;Constant    Pain Type  Chronic pain                       [facility] Adult PT Treatment/Exercise - 07/19/18 0001      Exercises   Exercises  Lumbar      Lumbar Exercises: Stretches   Passive Hamstring Stretch  Right;Left;5 reps    Lower Trunk Rotation  5 reps;10 seconds    Piriformis Stretch  Right;Left;2 reps    Piriformis Stretch Limitations  Pt demos inhibiting pain      Lumbar Exercises: Aerobic   Nustep  5 min UE and LE L4      Lumbar Exercises: Supine   Ab Set  10 reps   Cues for breathing during exercise.    Clam  10 reps   Cues for abdominal setting and breathing during clam shells.   Clam  Limitations  10 reps with yellow TB, 10 reps ball squeezes    Bridge Limitations  5 reps of initiating a minibridge       Modalities   Modalities  Energy manager  Bilat low back    Electrical Stimulation Action  IFC    Electrical Stimulation Parameters  4 Hz    Electrical Stimulation Goals  Pain      Manual Therapy   Manual Therapy  Soft tissue mobilization    Manual therapy comments  Pt demos painful expressions during effleurage.    Soft tissue mobilization  Provided to anterior and lateral thigh on both sides               PT Short Term Goals - 07/11/18 1025      PT SHORT TERM GOAL #1   Title  pt to be I with inital HEP     Time  3    Period  Weeks    Status  New      PT SHORT TERM GOAL #2   Title  He will be able to sit for 20 min without incr pain    Time  3    Period  Weeks    Status  New      PT SHORT TERM GOAL #3   Title  pt to reduce low back muscle tightness and guarding to decrease pain to </= 5/10 and promote trunk mobility for therapuetic progression (01/26/2017)    Time  4    Period  Weeks    Status  New      PT SHORT TERM GOAL #4   Title  He will report able to walk 200 yards without increased back pain    Time  4    Period  Weeks    Status  New        PT Long Term Goals - 07/11/18 1026      PT LONG TERM GOAL #1   Title  He will report pain decr  to 3/10 generally and able to walk without device    Time  8    Period  Weeks    Status  New      PT LONG TERM GOAL #2   Title  He will be able to sit for 30 min with support with no incr pain    Time  8    Period  Weeks    Status  New      PT LONG TERM GOAL #3   Title  He will be able to walk 1/4 mile without device with pain no > 3/10    Time  8    Period  Weeks    Status  New      PT LONG TERM GOAL #4   Title  increase FOTO score to </= 41% limited to demo improvement in function (02/16/17)    Time  8    Period   Weeks    Status  New      PT LONG TERM GOAL #5   Title  pt to be I with all HEP given as of last visit     Time  8    Period  Weeks    Status  New      Additional Long Term Goals   Additional Long Term Goals  Yes      PT LONG TERM GOAL #6   Title  He will report leg symptoms as intermittant    Time  8    Period  Weeks    Status  New            Plan - 07/19/18 1153    Clinical Impression Statement  Pt demos significant pain in bilat LEs and lower back that is constant and not improving. Pt was able to tolerate mild soft tissue mobilization ther ex. Mr. Spadafore demos hypersensitivity across lower back and near incision site. When apply IFC for the first time, he could only tolerate an intensity of 4. Added ball squeezes in hook lying with initiating a bridge with glut sets and posterior pelvic tilts.    Rehab Potential  Good    PT Frequency  2x / week    PT Treatment/Interventions  Dry needling;Patient/family education;Therapeutic exercise;Therapeutic activities;Moist Heat;Electrical Stimulation;Manual techniques;Passive range of motion    PT Next Visit Plan  REview HEP issued and past HEP, manual , modalities , strengthen core  flexion based to start. Continue manual to improve sensitivity.    PT Home Exercise Plan  ab set , hip abd/add gentle  Consulted and Agree with Plan of Care  Patient      During this treatment session, the therapist was present, participating in and directing the treatment.   Patient will benefit from skilled therapeutic intervention in order to improve the following deficits and impairments:  Pain, Postural dysfunction, Decreased strength, Decreased activity tolerance, Decreased range of motion, Increased muscle spasms, Difficulty walking  Visit Diagnosis: Muscle spasm of back  Muscle weakness (generalized)  Chronic bilateral low back pain with bilateral sciatica  Spondylolisthesis at L4-L5 level  Chronic left-sided low back pain with  left-sided sciatica  Other abnormalities of gait and mobility     Problem List Patient Active Problem List   Diagnosis Date Noted  . Lumbar radiculopathy, chronic 07/02/2017    Fuller Mandril, SPTA 07/19/2018, 12:13 PM   Hessie Diener, PTA 07/19/18 1:07 PM Phone: [phone] Fax: [facility] [place], [place], [address] Phone: [phone]   Fax:  [phone]  Name: ALOIS COLGAN MRN: [id number] Date of Birth: 04-01-1956 .PHYSICAL THERAPY DISCHARGE SUMMARY  Visits from Start of Care: 2  Current functional level related to goals / functional outcomes: Discontinued by Md and he will have surgery   Remaining deficits: See above   Education / Equipment: HEP Plan: Patient agrees to discharge.  Patient goals were not met. Patient is being discharged due to the physician's request.  ?????   Pearson Forster PT  07/28/2018

## 2018-07-21 ENCOUNTER — Ambulatory Visit: Payer: PRIVATE HEALTH INSURANCE | Admitting: Physical Therapy

## 2018-07-26 ENCOUNTER — Other Ambulatory Visit: Payer: Self-pay | Admitting: Neurological Surgery

## 2018-07-26 ENCOUNTER — Ambulatory Visit: Payer: PRIVATE HEALTH INSURANCE

## 2018-07-29 ENCOUNTER — Ambulatory Visit: Payer: PRIVATE HEALTH INSURANCE | Admitting: Physical Therapy

## 2018-08-02 MED FILL — PRAVASTATIN SODIUM 20 MG TA: 20 | 90 days supply | Qty: 90 | Fill #2

## 2018-08-02 NOTE — Pre-Procedure Instructions (Signed)
Rayhaan Gaetano Hawthorne  08/02/2018      [place], [place] - [address] [facility] [place] [address] Phone: [phone] Fax: [phone]    Your procedure is scheduled on January 27th.  Report to [facility] Admitting at 9:30 A.M.  Call this number if you have problems the morning of surgery:  [phone]   Remember:  Do not eat or drink after midnight.     Take these medicines the morning of surgery with A SIP OF WATER   Gabapentin (Neurontin)  Follow your surgeon's instructions on when to stop Asprin & Plavix.  If no instructions were given by your surgeon then you will need to call the office to get those instructions.    7 days prior to surgery STOP taking any Aleve, Naproxen, Ibuprofen, Motrin, Advil, Goody's, BC's, all herbal medications, fish oil, and all vitamins.     Do not wear jewelry.  Do not wear lotions, powders, colognes, or deodorant.  Men may shave face and neck.  Do not bring valuables to the hospital.  [facility] is not responsible for any belongings or valuables.   [place]- Preparing For Surgery  Before surgery, you can play an important role. Because skin is not sterile, your skin needs to be as free of germs as possible. You can reduce the number of germs on your skin by washing with CHG (chlorahexidine gluconate) Soap before surgery.  CHG is an antiseptic cleaner which kills germs and bonds with the skin to continue killing germs even after washing.    Oral Hygiene is also important to reduce your risk of infection.  Remember - BRUSH YOUR TEETH THE MORNING OF SURGERY WITH YOUR REGULAR TOOTHPASTE  Please do not use if you have an allergy to CHG or antibacterial soaps. If your skin becomes reddened/irritated stop using the CHG.  Do not shave (including legs and underarms) for at least 48 hours prior to first CHG shower. It is OK to shave your face.  Please follow these instructions  carefully.   1. Shower the NIGHT BEFORE SURGERY and the MORNING OF SURGERY with CHG.   2. If you chose to wash your hair, wash your hair first as usual with your normal shampoo.  3. After you shampoo, rinse your hair and body thoroughly to remove the shampoo.  4. Use CHG as you would any other liquid soap. You can apply CHG directly to the skin and wash gently with a scrungie or a clean washcloth.   5. Apply the CHG Soap to your body ONLY FROM THE NECK DOWN.  Do not use on open wounds or open sores. Avoid contact with your eyes, ears, mouth and genitals (private parts). Wash Face and genitals (private parts)  with your normal soap.  6. Wash thoroughly, paying special attention to the area where your surgery will be performed.  7. Thoroughly rinse your body with warm water from the neck down.  8. DO NOT shower/wash with your normal soap after using and rinsing off the CHG Soap.  9. Pat yourself dry with a CLEAN TOWEL.  10. Wear CLEAN PAJAMAS to bed the night before surgery, wear comfortable clothes the morning of surgery  11. Place CLEAN SHEETS on your bed the night of your first shower and DO NOT SLEEP WITH PETS.   Day of Surgery:  Do not apply any deodorants/lotions.  Please wear clean clothes to the hospital/surgery center.  Remember to brush your teeth WITH YOUR REGULAR TOOTHPASTE.   Contacts, dentures or bridgework may not be worn into surgery.  Leave your suitcase in the car.  After surgery it may be brought to your room.  For patients admitted to the hospital, discharge time will be determined by your treatment team.  Patients discharged the day of surgery will not be allowed to drive home.   Please read over the following fact sheets that you were given. Coughing and Deep Breathing, MRSA Information and Surgical Site Infection Prevention

## 2018-08-03 ENCOUNTER — Encounter (HOSPITAL_COMMUNITY)
Admission: RE | Admit: 2018-08-03 | Discharge: 2018-08-03 | Disposition: A | Discharge status: Home / Self Care | Payer: PRIVATE HEALTH INSURANCE | Source: Ambulatory Visit | Attending: Neurological Surgery | Admitting: Neurological Surgery

## 2018-08-03 ENCOUNTER — Other Ambulatory Visit: Payer: Self-pay

## 2018-08-03 ENCOUNTER — Encounter (HOSPITAL_COMMUNITY): Payer: Self-pay

## 2018-08-03 DIAGNOSIS — R9431 Abnormal electrocardiogram [ECG] [EKG]: Secondary | ICD-10-CM | POA: Insufficient documentation

## 2018-08-03 DIAGNOSIS — Z01818 Encounter for other preprocedural examination: Secondary | ICD-10-CM | POA: Insufficient documentation

## 2018-08-03 HISTORY — DX: Cerebral infarction, unspecified: I63.9

## 2018-08-03 LAB — CBC
HCT: 46.7 % (ref 39.0–52.0)
Hemoglobin: 15.4 g/dL (ref 13.0–17.0)
MCH: 27.9 pg (ref 26.0–34.0)
MCHC: 33 g/dL (ref 30.0–36.0)
MCV: 84.8 fL (ref 80.0–100.0)
Platelets: 270 10*3/uL (ref 150–400)
RBC: 5.51 MIL/uL (ref 4.22–5.81)
RDW: 13.7 % (ref 11.5–15.5)
WBC: 5.3 10*3/uL (ref 4.0–10.5)
nRBC: 0 % (ref 0.0–0.2)

## 2018-08-03 LAB — TYPE AND SCREEN
ABO/RH(D): O POS
Antibody Screen: NEGATIVE

## 2018-08-03 LAB — BASIC METABOLIC PANEL
ANION GAP: 9 (ref 5–15)
BUN: 15 mg/dL (ref 8–23)
CO2: 25 mmol/L (ref 22–32)
Calcium: 9.6 mg/dL (ref 8.9–10.3)
Chloride: 104 mmol/L (ref 98–111)
Creatinine, Ser: 1.25 mg/dL — ABNORMAL HIGH (ref 0.61–1.24)
GFR calc Af Amer: >60 mL/min (ref >60)
GFR calc non Af Amer: >60 mL/min (ref >60)
GLUCOSE: 113 mg/dL — AB (ref 70–99)
Potassium: 3.6 mmol/L (ref 3.5–5.1)
Sodium: 138 mmol/L (ref 135–145)

## 2018-08-03 LAB — SURGICAL PCR SCREEN
MRSA, PCR: NEGATIVE
Staphylococcus aureus: NEGATIVE

## 2018-08-03 LAB — ABO/RH: ABO/RH(D): O POS

## 2018-08-03 NOTE — Progress Notes (Signed)
PCP - Dr. Ihor Gully  Cardiologist - Denies  Chest x-ray - Denies  EKG - 08/03/2018  Stress Test - Denies  ECHO - Denies  Cardiac Cath - Denies  AICD- na PM- na LOOP- na  Sleep Study - Denies CPAP - Denies  LABS- 08/03/2018: CBC, BMP, T/S  ASA- LD- 1/20 Plavix- LD- 1/20    Anesthesia- Yes- EKG  Pt denies having chest pain, sob, or fever at this time. All instructions explained to the pt, with a verbal understanding of the material. Pt agrees to go over the instructions while at home for a better understanding. The opportunity to ask questions was provided.

## 2018-08-04 ENCOUNTER — Encounter: Payer: Self-pay | Admitting: Physical Therapy

## 2018-08-04 NOTE — Progress Notes (Addendum)
Anesthesia Chart Review:   Case:  [id number] Date/Time:  08/08/18 0715   Procedure:  Lumbar 4-5 Posterior lumbar interbody fusion (N/A Back) - Lumbar 4-5 Posterior lumbar interbody fusion   Anesthesia type:  General   Pre-op diagnosis:  Spondylolisthesis, Lumbar region   Location:  [facility] OR ROOM 21 / [facility]   Surgeon:  Kristeen Miss, MD      DISCUSSION: Patient is a 63 year old male scheduled for the above procedure.  History includes never smoker, HTN, prostate cancer (s/p prostatectomy 06/18/10). 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 He reported work-up by his PCP did not show a specific etiology. He denied any significant residual effects. He works as an Cabin crew at [employer].    His PCP Dr. Marisue Humble signed a note giving permission to hold Plavix 3 days for surgery. He reported last dose of ASA and Plavix 08/01/18.   Based on above information, I would anticipate that he can proceed as planned. He takes amlodipine in the mornings and did not see this on his preoperative instruction sheet, so I advised him that he could take this with sips of water on the morning of surgery.   VS: BP (!) 153/79   Pulse 83   Temp 36.8 C   Resp 20   Ht 5\' 10"  (1.778 m)   Wt 87.5 kg   SpO2 97%   BMI 27.69 kg/m   PROVIDERS: Gaynelle Arabian, MD is PCP. Last office note requested, but still pending.    LABS: Labs reviewed: Acceptable for surgery. (all labs ordered are listed, but only abnormal results are displayed)  Labs Reviewed  BASIC METABOLIC PANEL - Abnormal; Notable for the following components:      Result Value   Glucose, Bld 113 (*)    Creatinine, Ser 1.25 (*)    All other components within normal limits  SURGICAL PCR SCREEN  CBC  TYPE AND SCREEN  ABO/RH    EKG: 08/03/18: NSR, possible inferior infarct (age undetermined).  Interpreting cardiologist did not think there was any signifcant change since last tracing. (Comparison EKGs in [facility] from 09/16/99 and 06/12/10 printed and on chart).   CV: Carotid U/S 10/13/12: Summary: No significant extracranial carotid artery stenosis demonstrated. The right internal carotid artery demonstrates elevated velocities, likely due to tortuosity. Bilateral carotid sinuses are slightly dilated. Vertebrals are patent with antegrade flow.   Past Medical History:  Diagnosis Date  . Cancer [facility])    prostate cx, remission in 2012  . Hypertension     Past Surgical History:  Procedure Laterality Date  . LUMBAR LAMINECTOMY/DECOMPRESSION MICRODISCECTOMY Bilateral 07/02/2017   Procedure: BILATERAL LAMINOTOMY L 4-5;  Surgeon: Kristeen Miss, MD;  Location: [place];  Service: Neurosurgery;  Laterality: Bilateral;  . PROSTATECTOMY      MEDICATIONS: . amLODipine (NORVASC) 5 MG tablet  . aspirin 325 MG EC tablet  . clopidogrel (PLAVIX) 75 MG tablet  . gabapentin (NEURONTIN) 300 MG capsule  . hydrochlorothiazide (HYDRODIURIL) 25 MG tablet  . Multiple Vitamin (MULTIVITAMIN WITH MINERALS) TABS tablet  . pravastatin (PRAVACHOL) 20 MG tablet  . traMADol (ULTRAM) 50 MG tablet   No current facility-administered medications for this encounter.     Myra Gianotti, PA-C Surgical Short Stay/Anesthesiology [facility] Phone [phone] [facility] Phone [phone] 08/05/2018 11:02 AM

## 2018-08-05 ENCOUNTER — Encounter (HOSPITAL_COMMUNITY): Payer: Self-pay

## 2018-08-05 NOTE — Anesthesia Preprocedure Evaluation (Addendum)
Anesthesia Evaluation  Patient identified by MRN, date of birth, ID band Patient awake    Reviewed: Allergy & Precautions, NPO status , Patient's Chart, lab work & pertinent test results  History of Anesthesia Complications Negative for: history of anesthetic complications  Airway Mallampati: II  TM Distance: >3 FB Neck ROM: Full    Dental  (+) Dental Advisory Given, Teeth Intact   Pulmonary neg pulmonary ROS,    breath sounds clear to auscultation       Cardiovascular hypertension, Pt. on medications (-) angina Rhythm:Regular Rate:Normal     Neuro/Psych CVA, No Residual Symptoms negative psych ROS   GI/Hepatic negative GI ROS, Neg liver ROS,   Endo/Other  negative endocrine ROS  Renal/GU negative Renal ROS     Musculoskeletal negative musculoskeletal ROS (+)   Abdominal   Peds  Hematology negative hematology ROS (+)   Anesthesia Other Findings   Reproductive/Obstetrics                           Anesthesia Physical Anesthesia Plan  ASA: III  Anesthesia Plan: General   Post-op Pain Management:    Induction: Intravenous  PONV Risk Score and Plan: 3 and Treatment may vary due to age or medical condition, Ondansetron, Dexamethasone and Midazolam  Airway Management Planned: Oral ETT  Additional Equipment: None  Intra-op Plan:   Post-operative Plan: Extubation in OR  Informed Consent: I have reviewed the patients History and Physical, chart, labs and discussed the procedure including the risks, benefits and alternatives for the proposed anesthesia with the patient or authorized representative who has indicated his/her understanding and acceptance.     Dental advisory given  Plan Discussed with: CRNA and Anesthesiologist  Anesthesia Plan Comments: ( )      Anesthesia Quick Evaluation

## 2018-08-08 ENCOUNTER — Inpatient Hospital Stay (HOSPITAL_COMMUNITY): Payer: PRIVATE HEALTH INSURANCE

## 2018-08-08 ENCOUNTER — Inpatient Hospital Stay (HOSPITAL_COMMUNITY): Payer: PRIVATE HEALTH INSURANCE | Admitting: Registered Nurse

## 2018-08-08 ENCOUNTER — Encounter (HOSPITAL_COMMUNITY): Payer: Self-pay | Admitting: Registered Nurse

## 2018-08-08 ENCOUNTER — Other Ambulatory Visit: Payer: Self-pay

## 2018-08-08 ENCOUNTER — Inpatient Hospital Stay (HOSPITAL_COMMUNITY)
Admission: RE | Admit: 2018-08-08 | Discharge: 2018-08-10 | DRG: 455 | Disposition: A | Discharge status: Home / Self Care | Payer: PRIVATE HEALTH INSURANCE | Attending: Neurological Surgery | Admitting: Neurological Surgery

## 2018-08-08 ENCOUNTER — Encounter (HOSPITAL_COMMUNITY)
Admission: RE | Disposition: A | Discharge status: Home / Self Care | Payer: Self-pay | Source: Home / Self Care | Attending: Neurological Surgery

## 2018-08-08 ENCOUNTER — Inpatient Hospital Stay (HOSPITAL_COMMUNITY): Payer: PRIVATE HEALTH INSURANCE | Admitting: Vascular Surgery

## 2018-08-08 DIAGNOSIS — Z7902 Long term (current) use of antithrombotics/antiplatelets: Secondary | ICD-10-CM

## 2018-08-08 DIAGNOSIS — Z419 Encounter for procedure for purposes other than remedying health state, unspecified: Secondary | ICD-10-CM

## 2018-08-08 DIAGNOSIS — M4316 Spondylolisthesis, lumbar region: Secondary | ICD-10-CM | POA: Diagnosis present

## 2018-08-08 DIAGNOSIS — I10 Essential (primary) hypertension: Secondary | ICD-10-CM | POA: Diagnosis present

## 2018-08-08 DIAGNOSIS — Z79899 Other long term (current) drug therapy: Secondary | ICD-10-CM

## 2018-08-08 DIAGNOSIS — Z7982 Long term (current) use of aspirin: Secondary | ICD-10-CM | POA: Diagnosis not present

## 2018-08-08 DIAGNOSIS — M5416 Radiculopathy, lumbar region: Secondary | ICD-10-CM | POA: Diagnosis present

## 2018-08-08 DIAGNOSIS — M48061 Spinal stenosis, lumbar region without neurogenic claudication: Secondary | ICD-10-CM | POA: Diagnosis present

## 2018-08-08 DIAGNOSIS — Z8673 Personal history of transient ischemic attack (TIA), and cerebral infarction without residual deficits: Secondary | ICD-10-CM | POA: Diagnosis not present

## 2018-08-08 DIAGNOSIS — Z8546 Personal history of malignant neoplasm of prostate: Secondary | ICD-10-CM

## 2018-08-08 SURGERY — POSTERIOR LUMBAR FUSION 1 LEVEL
Anesthesia: General | Site: Spine Lumbar

## 2018-08-08 MED ORDER — ONDANSETRON HCL 4 MG PO TABS: 4.0000 mg | ORAL_TABLET | Freq: Four times a day (QID) | ORAL | Status: DC | PRN

## 2018-08-08 MED ORDER — LIDOCAINE-EPINEPHRINE 1 %-1:100000 IJ SOLN
INTRAMUSCULAR | Status: DC | PRN
  Administered 2018-08-08: 5 mL

## 2018-08-08 MED ORDER — 0.9 % SODIUM CHLORIDE (POUR BTL) OPTIME
TOPICAL | Status: DC | PRN
  Administered 2018-08-08: 1000 mL

## 2018-08-08 MED ORDER — GABAPENTIN 300 MG PO CAPS
300.0000 mg | ORAL_CAPSULE | Freq: Two times a day (BID) | ORAL | Status: DC
  Administered 2018-08-08 – 2018-08-10 (×4): 300 mg via ORAL
  Filled 2018-08-08 (×3): qty 1

## 2018-08-08 MED ORDER — SUGAMMADEX SODIUM 200 MG/2ML IV SOLN
INTRAVENOUS | Status: DC | PRN
  Administered 2018-08-08: 200 mg via INTRAVENOUS

## 2018-08-08 MED ORDER — ROCURONIUM BROMIDE 50 MG/5ML IV SOSY
PREFILLED_SYRINGE | INTRAVENOUS | Status: AC
  Filled 2018-08-08: qty 5

## 2018-08-08 MED ORDER — OXYCODONE HCL 5 MG PO TABS
ORAL_TABLET | ORAL | Status: AC
  Filled 2018-08-08: qty 1

## 2018-08-08 MED ORDER — THROMBIN 5000 UNITS EX SOLR
CUTANEOUS | Status: AC
  Filled 2018-08-08: qty 5000

## 2018-08-08 MED ORDER — MIDAZOLAM HCL 2 MG/2ML IJ SOLN
INTRAMUSCULAR | Status: AC
  Filled 2018-08-08: qty 2

## 2018-08-08 MED ORDER — PRAVASTATIN SODIUM 10 MG PO TABS
20.0000 mg | ORAL_TABLET | Freq: Every day | ORAL | Status: DC
  Administered 2018-08-08 – 2018-08-09 (×2): 20 mg via ORAL
  Filled 2018-08-08 (×2): qty 2

## 2018-08-08 MED ORDER — LACTATED RINGERS IV SOLN
INTRAVENOUS | Status: DC | PRN
  Administered 2018-08-08: 08:00:00 via INTRAVENOUS

## 2018-08-08 MED ORDER — THROMBIN 5000 UNITS EX SOLR
OROMUCOSAL | Status: DC | PRN
  Administered 2018-08-08: 5 mL via TOPICAL

## 2018-08-08 MED ORDER — OXYCODONE-ACETAMINOPHEN 5-325 MG PO TABS
1.0000 | ORAL_TABLET | ORAL | Status: DC | PRN
  Administered 2018-08-08 – 2018-08-09 (×3): 2 via ORAL
  Filled 2018-08-08 (×3): qty 2

## 2018-08-08 MED ORDER — DOCUSATE SODIUM 100 MG PO CAPS
100.0000 mg | ORAL_CAPSULE | Freq: Two times a day (BID) | ORAL | Status: DC
  Administered 2018-08-08 – 2018-08-10 (×5): 100 mg via ORAL
  Filled 2018-08-08 (×5): qty 1

## 2018-08-08 MED ORDER — SODIUM CHLORIDE 0.9 % IV SOLN: 250.0000 mL | INTRAVENOUS | Status: DC

## 2018-08-08 MED ORDER — CEFAZOLIN SODIUM 1 G IJ SOLR
INTRAMUSCULAR | Status: AC
  Filled 2018-08-08: qty 20

## 2018-08-08 MED ORDER — OXYCODONE HCL 5 MG/5ML PO SOLN: 5.0000 mg | Freq: Once | ORAL | Status: AC | PRN

## 2018-08-08 MED ORDER — ACETAMINOPHEN 650 MG RE SUPP: 650.0000 mg | RECTAL | Status: DC | PRN

## 2018-08-08 MED ORDER — METHOCARBAMOL 500 MG PO TABS
500.0000 mg | ORAL_TABLET | Freq: Four times a day (QID) | ORAL | Status: DC | PRN
  Administered 2018-08-08 – 2018-08-10 (×5): 500 mg via ORAL
  Filled 2018-08-08 (×4): qty 1

## 2018-08-08 MED ORDER — CHLORHEXIDINE GLUCONATE CLOTH 2 % EX PADS: 6.0000 | MEDICATED_PAD | Freq: Once | CUTANEOUS | Status: DC

## 2018-08-08 MED ORDER — TRAMADOL HCL 50 MG PO TABS
50.0000 mg | ORAL_TABLET | Freq: Every day | ORAL | Status: DC
  Administered 2018-08-08 – 2018-08-10 (×3): 50 mg via ORAL
  Filled 2018-08-08 (×3): qty 1

## 2018-08-08 MED ORDER — PHENOL 1.4 % MT LIQD: 1.0000 | OROMUCOSAL | Status: DC | PRN

## 2018-08-08 MED ORDER — ARTIFICIAL TEARS OPHTHALMIC OINT
TOPICAL_OINTMENT | OPHTHALMIC | Status: DC | PRN
  Administered 2018-08-08: 1 via OPHTHALMIC

## 2018-08-08 MED ORDER — AMLODIPINE BESYLATE 5 MG PO TABS
5.0000 mg | ORAL_TABLET | Freq: Every day | ORAL | Status: DC
  Administered 2018-08-09 – 2018-08-10 (×2): 5 mg via ORAL
  Filled 2018-08-08 (×2): qty 1

## 2018-08-08 MED ORDER — BUPIVACAINE HCL (PF) 0.5 % IJ SOLN
INTRAMUSCULAR | Status: DC | PRN
  Administered 2018-08-08: 5 mL

## 2018-08-08 MED ORDER — FENTANYL CITRATE (PF) 100 MCG/2ML IJ SOLN: 25.0000 ug | INTRAMUSCULAR | Status: DC | PRN

## 2018-08-08 MED ORDER — POLYETHYLENE GLYCOL 3350 17 G PO PACK: 17.0000 g | PACK | Freq: Every day | ORAL | Status: DC | PRN

## 2018-08-08 MED ORDER — SODIUM CHLORIDE 0.9% FLUSH
3.0000 mL | Freq: Two times a day (BID) | INTRAVENOUS | Status: DC
  Administered 2018-08-08 – 2018-08-09 (×2): 3 mL via INTRAVENOUS

## 2018-08-08 MED ORDER — MIDAZOLAM HCL 5 MG/5ML IJ SOLN
INTRAMUSCULAR | Status: DC | PRN
  Administered 2018-08-08: 2 mg via INTRAVENOUS

## 2018-08-08 MED ORDER — DEXAMETHASONE SODIUM PHOSPHATE 10 MG/ML IJ SOLN
INTRAMUSCULAR | Status: DC | PRN
  Administered 2018-08-08: 10 mg via INTRAVENOUS

## 2018-08-08 MED ORDER — PHENYLEPHRINE 40 MCG/ML (10ML) SYRINGE FOR IV PUSH (FOR BLOOD PRESSURE SUPPORT)
PREFILLED_SYRINGE | INTRAVENOUS | Status: AC
  Filled 2018-08-08: qty 10

## 2018-08-08 MED ORDER — HYDROCHLOROTHIAZIDE 25 MG PO TABS
25.0000 mg | ORAL_TABLET | Freq: Every day | ORAL | Status: DC
  Administered 2018-08-08 – 2018-08-10 (×3): 25 mg via ORAL
  Filled 2018-08-08 (×3): qty 1

## 2018-08-08 MED ORDER — LACTATED RINGERS IV SOLN
INTRAVENOUS | Status: DC
  Administered 2018-08-08: 14:00:00 via INTRAVENOUS

## 2018-08-08 MED ORDER — THROMBIN 20000 UNITS EX SOLR
CUTANEOUS | Status: AC
  Filled 2018-08-08: qty 20000

## 2018-08-08 MED ORDER — ARTIFICIAL TEARS OPHTHALMIC OINT
TOPICAL_OINTMENT | OPHTHALMIC | Status: AC
  Filled 2018-08-08: qty 3.5

## 2018-08-08 MED ORDER — EPHEDRINE SULFATE-NACL 50-0.9 MG/10ML-% IV SOSY
PREFILLED_SYRINGE | INTRAVENOUS | Status: DC | PRN
  Administered 2018-08-08: 5 mg via INTRAVENOUS

## 2018-08-08 MED ORDER — FENTANYL CITRATE (PF) 250 MCG/5ML IJ SOLN
INTRAMUSCULAR | Status: DC | PRN
  Administered 2018-08-08 (×10): 50 ug via INTRAVENOUS

## 2018-08-08 MED ORDER — CEFAZOLIN SODIUM-DEXTROSE 2-4 GM/100ML-% IV SOLN
2.0000 g | INTRAVENOUS | Status: AC
  Administered 2018-08-08 (×2): 2 g via INTRAVENOUS
  Filled 2018-08-08: qty 100

## 2018-08-08 MED ORDER — SODIUM CHLORIDE 0.9% FLUSH: 3.0000 mL | INTRAVENOUS | Status: DC | PRN

## 2018-08-08 MED ORDER — OXYCODONE HCL 5 MG PO TABS
5.0000 mg | ORAL_TABLET | Freq: Once | ORAL | Status: AC | PRN
  Administered 2018-08-08: 5 mg via ORAL

## 2018-08-08 MED ORDER — BUPIVACAINE HCL (PF) 0.5 % IJ SOLN
INTRAMUSCULAR | Status: AC
  Filled 2018-08-08: qty 30

## 2018-08-08 MED ORDER — THROMBIN 20000 UNITS EX SOLR
CUTANEOUS | Status: DC | PRN
  Administered 2018-08-08: 20 mL via TOPICAL

## 2018-08-08 MED ORDER — KETOROLAC TROMETHAMINE 15 MG/ML IJ SOLN
INTRAMUSCULAR | Status: AC
  Administered 2018-08-08: 15 mg via INTRAVENOUS
  Filled 2018-08-08: qty 1

## 2018-08-08 MED ORDER — MENTHOL 3 MG MT LOZG: 1.0000 | LOZENGE | OROMUCOSAL | Status: DC | PRN

## 2018-08-08 MED ORDER — ALBUMIN HUMAN 5 % IV SOLN
INTRAVENOUS | Status: DC | PRN
  Administered 2018-08-08 (×2): via INTRAVENOUS

## 2018-08-08 MED ORDER — LIDOCAINE 2% (20 MG/ML) 5 ML SYRINGE
INTRAMUSCULAR | Status: DC | PRN
  Administered 2018-08-08: 80 mg via INTRAVENOUS

## 2018-08-08 MED ORDER — METHOCARBAMOL 500 MG PO TABS
ORAL_TABLET | ORAL | Status: AC
  Filled 2018-08-08: qty 1

## 2018-08-08 MED ORDER — KETOROLAC TROMETHAMINE 15 MG/ML IJ SOLN
15.0000 mg | Freq: Four times a day (QID) | INTRAMUSCULAR | Status: AC
  Administered 2018-08-08 – 2018-08-09 (×4): 15 mg via INTRAVENOUS
  Filled 2018-08-08 (×3): qty 1

## 2018-08-08 MED ORDER — FLEET ENEMA 7-19 GM/118ML RE ENEM: 1.0000 | ENEMA | Freq: Once | RECTAL | Status: DC | PRN

## 2018-08-08 MED ORDER — FENTANYL CITRATE (PF) 250 MCG/5ML IJ SOLN
INTRAMUSCULAR | Status: AC
  Filled 2018-08-08: qty 5

## 2018-08-08 MED ORDER — LIDOCAINE-EPINEPHRINE 1 %-1:100000 IJ SOLN
INTRAMUSCULAR | Status: AC
  Filled 2018-08-08: qty 1

## 2018-08-08 MED ORDER — ROCURONIUM BROMIDE 50 MG/5ML IV SOSY
PREFILLED_SYRINGE | INTRAVENOUS | Status: AC
  Filled 2018-08-08: qty 10

## 2018-08-08 MED ORDER — SODIUM CHLORIDE 0.9 % IV SOLN
INTRAVENOUS | Status: DC | PRN
  Administered 2018-08-08: 15 ug/min via INTRAVENOUS

## 2018-08-08 MED ORDER — ONDANSETRON HCL 4 MG/2ML IJ SOLN: 4.0000 mg | Freq: Four times a day (QID) | INTRAMUSCULAR | Status: DC | PRN

## 2018-08-08 MED ORDER — SENNA 8.6 MG PO TABS
1.0000 | ORAL_TABLET | Freq: Two times a day (BID) | ORAL | Status: DC
  Administered 2018-08-08 – 2018-08-10 (×5): 8.6 mg via ORAL
  Filled 2018-08-08 (×5): qty 1

## 2018-08-08 MED ORDER — ROCURONIUM BROMIDE 10 MG/ML (PF) SYRINGE
PREFILLED_SYRINGE | INTRAVENOUS | Status: DC | PRN
  Administered 2018-08-08: 20 mg via INTRAVENOUS
  Administered 2018-08-08: 50 mg via INTRAVENOUS
  Administered 2018-08-08 (×2): 10 mg via INTRAVENOUS
  Administered 2018-08-08: 20 mg via INTRAVENOUS

## 2018-08-08 MED ORDER — METHOCARBAMOL 1000 MG/10ML IJ SOLN
500.0000 mg | Freq: Four times a day (QID) | INTRAVENOUS | Status: DC | PRN
  Filled 2018-08-08: qty 5

## 2018-08-08 MED ORDER — BISACODYL 10 MG RE SUPP: 10.0000 mg | Freq: Every day | RECTAL | Status: DC | PRN

## 2018-08-08 MED ORDER — PROPOFOL 10 MG/ML IV BOLUS
INTRAVENOUS | Status: AC
  Filled 2018-08-08: qty 20

## 2018-08-08 MED ORDER — MORPHINE SULFATE (PF) 4 MG/ML IV SOLN: 4.0000 mg | INTRAVENOUS | Status: DC | PRN

## 2018-08-08 MED ORDER — PHENYLEPHRINE 40 MCG/ML (10ML) SYRINGE FOR IV PUSH (FOR BLOOD PRESSURE SUPPORT)
PREFILLED_SYRINGE | INTRAVENOUS | Status: DC | PRN
  Administered 2018-08-08 (×3): 80 ug via INTRAVENOUS

## 2018-08-08 MED ORDER — ACETAMINOPHEN 325 MG PO TABS: 650.0000 mg | ORAL_TABLET | ORAL | Status: DC | PRN

## 2018-08-08 MED ORDER — PROPOFOL 10 MG/ML IV BOLUS
INTRAVENOUS | Status: DC | PRN
  Administered 2018-08-08: 170 mg via INTRAVENOUS

## 2018-08-08 MED ORDER — PROMETHAZINE HCL 25 MG/ML IJ SOLN: 6.2500 mg | INTRAMUSCULAR | Status: DC | PRN

## 2018-08-08 MED ORDER — SODIUM CHLORIDE 0.9 % IV SOLN
INTRAVENOUS | Status: DC | PRN
  Administered 2018-08-08: 500 mL

## 2018-08-08 MED ORDER — ONDANSETRON HCL 4 MG/2ML IJ SOLN
INTRAMUSCULAR | Status: DC | PRN
  Administered 2018-08-08: 4 mg via INTRAVENOUS

## 2018-08-08 MED ORDER — CEFAZOLIN SODIUM-DEXTROSE 2-4 GM/100ML-% IV SOLN
2.0000 g | Freq: Three times a day (TID) | INTRAVENOUS | Status: AC
  Administered 2018-08-08 – 2018-08-09 (×2): 2 g via INTRAVENOUS
  Filled 2018-08-08 (×2): qty 100

## 2018-08-08 SURGICAL SUPPLY — 67 items
BAG DECANTER FOR FLEXI CONT (MISCELLANEOUS) ×2 IMPLANT
BASKET BONE COLLECTION (BASKET) ×2 IMPLANT
BLADE CLIPPER SURG (BLADE) IMPLANT
BONE CANC CHIPS 40CC CAN1/2 (Bone Implant) ×2 IMPLANT
BUR MATCHSTICK NEURO 3.0 LAGG (BURR) ×2 IMPLANT
CANISTER SUCT 3000ML PPV (MISCELLANEOUS) ×2 IMPLANT
CHIPS CANC BONE 40CC CAN1/2 (Bone Implant) ×1 IMPLANT
CONT SPEC 4OZ CLIKSEAL STRL BL (MISCELLANEOUS) ×2 IMPLANT
COVER BACK TABLE 60X90IN (DRAPES) ×2 IMPLANT
COVER WAND RF STERILE (DRAPES) IMPLANT
DECANTER SPIKE VIAL GLASS SM (MISCELLANEOUS) ×4 IMPLANT
DERMABOND ADVANCED (GAUZE/BANDAGES/DRESSINGS) ×1
DERMABOND ADVANCED .7 DNX12 (GAUZE/BANDAGES/DRESSINGS) ×1 IMPLANT
DEVICE DISSECT PLASMABLAD 3.0S (MISCELLANEOUS) ×1 IMPLANT
DRAPE C-ARM 42X72 X-RAY (DRAPES) ×4 IMPLANT
DRAPE HALF SHEET 40X57 (DRAPES) IMPLANT
DRAPE LAPAROTOMY 100X72X124 (DRAPES) ×2 IMPLANT
DRSG OPSITE POSTOP 4X6 (GAUZE/BANDAGES/DRESSINGS) ×2 IMPLANT
DURAPREP 26ML APPLICATOR (WOUND CARE) ×2 IMPLANT
DURASEAL APPLICATOR TIP (TIP) IMPLANT
DURASEAL SPINE SEALANT 3ML (MISCELLANEOUS) IMPLANT
ELECT REM PT RETURN 9FT ADLT (ELECTROSURGICAL) ×2
ELECTRODE REM PT RTRN 9FT ADLT (ELECTROSURGICAL) ×1 IMPLANT
GAUZE 4X4 16PLY RFD (DISPOSABLE) IMPLANT
GAUZE SPONGE 4X4 12PLY STRL (GAUZE/BANDAGES/DRESSINGS) IMPLANT
GLOVE BIO SURGEON STRL SZ7 (GLOVE) ×2 IMPLANT
GLOVE BIO SURGEON STRL SZ8 (GLOVE) ×2 IMPLANT
GLOVE BIO SURGEON STRL SZ8.5 (GLOVE) ×2 IMPLANT
GLOVE BIOGEL PI IND STRL 8.5 (GLOVE) ×2 IMPLANT
GLOVE BIOGEL PI INDICATOR 8.5 (GLOVE) ×2
GLOVE ECLIPSE 8.5 STRL (GLOVE) ×4 IMPLANT
GLOVE SURG SS PI 7.5 STRL IVOR (GLOVE) ×12 IMPLANT
GOWN STRL REUS W/ TWL LRG LVL3 (GOWN DISPOSABLE) ×2 IMPLANT
GOWN STRL REUS W/ TWL XL LVL3 (GOWN DISPOSABLE) ×1 IMPLANT
GOWN STRL REUS W/TWL 2XL LVL3 (GOWN DISPOSABLE) ×4 IMPLANT
GOWN STRL REUS W/TWL LRG LVL3 (GOWN DISPOSABLE) ×2
GOWN STRL REUS W/TWL XL LVL3 (GOWN DISPOSABLE) ×1
HEMOSTAT POWDER KIT SURGIFOAM (HEMOSTASIS) ×2 IMPLANT
KIT BASIN OR (CUSTOM PROCEDURE TRAY) ×2 IMPLANT
KIT INFUSE X SMALL 1.4CC (Orthopedic Implant) ×2 IMPLANT
KIT TURNOVER KIT B (KITS) ×2 IMPLANT
MILL MEDIUM DISP (BLADE) ×2 IMPLANT
NEEDLE HYPO 22GX1.5 SAFETY (NEEDLE) ×2 IMPLANT
NEEDLE SPNL 18GX3.5 QUINCKE PK (NEEDLE) ×2 IMPLANT
NS IRRIG 1000ML POUR BTL (IV SOLUTION) ×2 IMPLANT
PACK LAMINECTOMY NEURO (CUSTOM PROCEDURE TRAY) ×2 IMPLANT
PAD ARMBOARD 7.5X6 YLW CONV (MISCELLANEOUS) ×10 IMPLANT
PATTIES SURGICAL .5 X1 (DISPOSABLE) ×2 IMPLANT
PLASMABLADE 3.0S (MISCELLANEOUS) ×2
ROD LORD TI 5.5X35 (Rod) ×4 IMPLANT
SCREW KODIAK 6.5X40 (Screw) ×4 IMPLANT
SCREW KODIAK 6.5X45 (Screw) ×4 IMPLANT
SET SCREW (Screw) ×4 IMPLANT
SET SCREW SPNE (Screw) ×4 IMPLANT
SPACER IDENTITI PS 11X9X25 10D (Spacer) ×4 IMPLANT
SPONGE LAP 4X18 RFD (DISPOSABLE) IMPLANT
SPONGE SURGIFOAM ABS GEL 100 (HEMOSTASIS) ×2 IMPLANT
SUT PROLENE 6 0 BV (SUTURE) IMPLANT
SUT VIC AB 1 CT1 18XBRD ANBCTR (SUTURE) ×1 IMPLANT
SUT VIC AB 1 CT1 8-18 (SUTURE) ×1
SUT VIC AB 2-0 CP2 18 (SUTURE) ×2 IMPLANT
SUT VIC AB 3-0 SH 8-18 (SUTURE) ×2 IMPLANT
SYR 3ML LL SCALE MARK (SYRINGE) ×8 IMPLANT
TOWEL GREEN STERILE (TOWEL DISPOSABLE) ×2 IMPLANT
TOWEL GREEN STERILE FF (TOWEL DISPOSABLE) ×2 IMPLANT
TRAY FOLEY MTR SLVR 16FR STAT (SET/KITS/TRAYS/PACK) ×2 IMPLANT
WATER STERILE IRR 1000ML POUR (IV SOLUTION) ×2 IMPLANT

## 2018-08-08 NOTE — Progress Notes (Signed)
Orthopedic Tech Progress Note Patient Details:  Ralph Byrd 06/26/1956 [id number] Called in order  Patient ID: Ralph Byrd, male   DOB: 09-06-1955, 63 y.o.   MRN: [id number]   [name] 08/08/2018, 12:29 PM

## 2018-08-08 NOTE — Plan of Care (Signed)
  Problem: Education: Goal: Ability to verbalize activity precautions or restrictions will improve Outcome: Progressing Goal: Knowledge of the prescribed therapeutic regimen will improve Outcome: Progressing Goal: Understanding of discharge needs will improve Outcome: Progressing   Problem: Pain Management: Goal: Pain level will decrease Outcome: Progressing

## 2018-08-08 NOTE — Transfer of Care (Signed)
Immediate Anesthesia Transfer of Care Note  Patient: Ralph Byrd  Procedure(s) Performed: Lumbar Four-Five Posterior lumbar interbody fusion (N/A Spine Lumbar)  Patient Location: PACU  Anesthesia Type:General  Level of Consciousness: awake, alert  and oriented  Airway & Oxygen Therapy: Patient Spontanous Breathing and Patient connected to nasal cannula oxygen  Post-op Assessment: Report given to RN and Post -op Vital signs reviewed and stable  Post vital signs: Reviewed and stable  Last Vitals:  Vitals Value Taken Time  BP 127/79 08/08/2018 11:20 AM  Temp 36.5 C 08/08/2018 11:20 AM  Pulse 88 08/08/2018 11:27 AM  Resp 14 08/08/2018 11:27 AM  SpO2 98 % 08/08/2018 11:27 AM  Vitals shown include unvalidated device data.  Last Pain:  Vitals:   08/08/18 1120  TempSrc:   PainSc: 6      Abx re-dosed in PACU - see intraoperative documentation.    Complications: No apparent anesthesia complications

## 2018-08-08 NOTE — H&P (Signed)
Ralph Byrd is an 63 y.o. male.   Chief Complaint: Back and bilateral leg pain HPI: Ralph Byrd is a 63 year old individual who had a spondylolisthesis with stenosis and a synovial cyst a year ago.  He underwent surgical decompression and had good initial relief..  He has developed a significant recurrence of pain in the past year and was found to have involved a spondylolisthesis at L4-L5 with evidence of severe stenosis at L4-L5.  Is failed efforts at conservative management of this process and was advised regarding surgical decompression and stabilization of the L4-L5 joint.  This is now being undertaken.  Past Medical History:  Diagnosis Date  . Cancer [facility])    prostate cx, remission in 2012  . CVA (cerebral vascular accident) ([place])    09/24/12 MRI: 9 x 12 mm acute/subacute infarction affecting the left lateral thalamus/posterior limb internal capsule  . Hypertension     Past Surgical History:  Procedure Laterality Date  . LUMBAR LAMINECTOMY/DECOMPRESSION MICRODISCECTOMY Bilateral 07/02/2017   Procedure: BILATERAL LAMINOTOMY L 4-5;  Surgeon: Kristeen Miss, MD;  Location: [place];  Service: Neurosurgery;  Laterality: Bilateral;  . PROSTATECTOMY      No family history on file. Social History:  reports that he has never smoked. He has never used smokeless tobacco. He reports that he does not drink alcohol. No history on file for drug.  Allergies: No Known Allergies  No medications prior to admission.    No results found for this or any previous visit (from the past 48 hour(s)). No results found.  ROS  There were no vitals taken for this visit. Physical Exam  Constitutional: He is oriented to person, place, and time. He appears well-developed and well-nourished.  HENT:  Head: Normocephalic and atraumatic.  Eyes: Pupils are equal, round, and reactive to light. Conjunctivae and EOM are normal.  Neck: Normal range of motion. Neck supple.  Cardiovascular: Normal rate and  regular rhythm.  Respiratory: Effort normal and breath sounds normal.  GI: Soft. Bowel sounds are normal.  Musculoskeletal:     Comments: Straight leg raising at 15 degrees in either lower extremity.  Hetrick's maneuver is negative.  Neurological: He is alert and oriented to person, place, and time.  Absent Achilles reflexes bilaterally patellar reflexes are 1+.  Mild weakness in the tibialis anterior worse on the left than on the right graded at 4 out of 5 on the left side 4+ out of 5 on the right gastroc strength appears intact.  Upper extremity strength and reflexes and cranial nerves are normal.  Skin: Skin is warm and dry.  Psychiatric: He has a normal mood and affect. His behavior is normal. Judgment and thought content normal.     Assessment/Plan Spondylolisthesis L4-L5.  History of previous surgery for decompression of stenosis and a synovial cyst.  Plan: Patient is being admitted to undergo surgical decompression and stabilization of L4-L5.  Earleen Newport, MD 08/08/2018, 3:18 AM

## 2018-08-08 NOTE — Anesthesia Postprocedure Evaluation (Signed)
Anesthesia Post Note  Patient: Ralph Byrd  Procedure(s) Performed: Lumbar Four-Five Posterior lumbar interbody fusion (N/A Spine Lumbar)     Patient location during evaluation: PACU Anesthesia Type: General Level of consciousness: awake and alert Pain management: pain level controlled Vital Signs Assessment: post-procedure vital signs reviewed and stable Respiratory status: spontaneous breathing, nonlabored ventilation and respiratory function stable Cardiovascular status: blood pressure returned to baseline and stable Postop Assessment: no apparent nausea or vomiting Anesthetic complications: no    Last Vitals:  Vitals:   08/08/18 1235 08/08/18 1258  BP: 130/78 120/78  Pulse: 90 86  Resp: 14 18  Temp: 36.5 C 36.5 C  SpO2: 95% 100%                   [name]

## 2018-08-08 NOTE — Anesthesia Procedure Notes (Addendum)
Procedure Name: Intubation Date/Time: 08/08/2018 7:40 AM Performed by: Jearld Pies, CRNA Pre-anesthesia Checklist: Patient identified, Emergency Drugs available, Suction available and Patient being monitored Patient Re-evaluated:Patient Re-evaluated prior to induction Oxygen Delivery Method: Circle System Utilized Preoxygenation: Pre-oxygenation with 100% oxygen Induction Type: IV induction Ventilation: Mask ventilation without difficulty and Oral airway inserted - appropriate to patient size Laryngoscope Size: Glidescope and 4 Grade View: Grade II Tube type: Oral Tube size: 7.5 mm Number of attempts: 1 Airway Equipment and Method: Stylet and Oral airway Placement Confirmation: ETT inserted through vocal cords under direct vision,  positive ETCO2 and breath sounds checked- equal and bilateral Secured at: 23 cm Tube secured with: Tape Dental Injury: Teeth and Oropharynx as per pre-operative assessment  Comments: Glidescope utilized d/t previous Grade 3 view from 2018 records, large tongue, & prominent incisors.

## 2018-08-08 NOTE — Op Note (Signed)
Date of surgery: 08/08/2018 Preoperative diagnosis: Spondylolisthesis L4-L5 with bilateral L4-L5 radiculopathy, neurogenic claudication Postoperative diagnosis: The same Procedure: L4-5 decompressive laminectomy decompression of L4 and L5 nerve roots, posterior lumbar interbody arthrodesis with titanium spacers (Alphatec) local autograft and allograft, pedicle screw fixation L4-5, posterior lateral arthrodesis L4-5, infuse  Surgeon: Kristeen Miss M.D.  Asst.: Newman Pies, MD.  Indications: Patient is a 63 year old individual who underwent a surgical decompression for severe stenosis at L4-L5 year ago.  He had a mild spondylolisthesis but this is worsened considerably in having recurred his pain as severely as he had before he was advised regarding the need for surgical stabilization after decompression of the L4-L5 joint.  He is now being taken to the operating room for that procedure.  Procedure: Patient was brought to the operating room supine on the stretcher after the smooth induction of general endotracheal anesthesia, he was turned prone.  The back was prepped with alcohol DuraPrep and draped in a sterile fashion.  Elliptical incision was made around his previous scar and this was excised dissection was carried down the lumbodorsal fascia which was opened on either side of midline to expose the interlaminar space at L4 and L5.  Thick and dense scar in the interlaminar space at L4-L5 was encountered and this was carefully cleared.  Then a laminotomy removing the inferior margin lamina of L4 out to and including the entirety of the facet at L4-5 was performed this allowed better exploration of the common dural tube there was however the dense adherent scar tissue to the dura on either side and this made mobilization of the common dural tube and the L5 nerve root inferiorly and the L4 nerve root superiorly very slow and cautious as there was much adherent scar tissue.  Ultimately though the nerve  root could be identified and mobilized and the common dural tube was similarly identified and mobilized to allow exposure of the disc space.  The space was then entered with a #15 blade and a combination of curettes and rongeurs were used to evacuate substantial quantities of severely degenerated desiccated disc material from the L4-L5 space.  The endplates were then rongeured using a Le[place]ell and curettes with small teeth to remove portions of the cartilaginous endplate.  The procedure was continued from one side to the other after a similar laminotomy was created on the opposite side and the dura was mobilized medially a self-retaining spreader could be placed in 1 side of the disc space to allow better exposure of the opposite disc space at L4-5.  Ultimately it is felt that a 12 mm tall spacer with 10 degrees of lordosis would fit well into the interval between L4 and L5 this was a titanium spacer that was packed with autograft allograft that was mixed with infuse.  A total of 9 cc of bone graft was packed into the interspace along with the 2 spacers.  Attention was then turned to the pedicle and pedicle entry sites were chosen at L4 and L5.  At L4 6.5 x 40 mm screws were placed into the pedicles and directed towards the superior portion of the body of L4.  At L5 6.5 x 45 mm screws were used.  These were connected together with short 35 mm precontoured connecting rods.  Prior to this the lateral gutters were decorticated these were each packed with 6 cc of local autograft allograft and infuse mixture.  Once all the graft was placed and the hardware was tightened to its final position  final radiographs identified good position of the fixation and hardware and then the procedure was completed by carefully inspecting the wound for hemostasis and closing the fascia with #1 Vicryl in interrupted fashion using 2-0 Vicryl in the subcutaneous tissues and 3-0 Vicryl subcuticularly.  Dermabond was used on the skin.  Blood  loss is estimated at 500 cc.  No Cell Saver blood was returned to the patient.

## 2018-08-09 ENCOUNTER — Encounter: Payer: Self-pay | Admitting: Physical Therapy

## 2018-08-09 MED ORDER — HYDROCODONE-ACETAMINOPHEN 5-325 MG PO TABS
1.0000 | ORAL_TABLET | ORAL | Status: DC | PRN
  Administered 2018-08-09 – 2018-08-10 (×4): 2 via ORAL
  Filled 2018-08-09 (×4): qty 2

## 2018-08-09 MED ORDER — METHOCARBAMOL 500 MG PO TABS: 500.0000 mg | ORAL_TABLET | Freq: Four times a day (QID) | ORAL | 3 refills | Status: AC | PRN

## 2018-08-09 MED ORDER — HYDROCODONE-ACETAMINOPHEN 5-325 MG PO TABS: 1.0000 | ORAL_TABLET | ORAL | 0 refills | Status: AC | PRN

## 2018-08-09 NOTE — Progress Notes (Signed)
Patient ID: Ralph Byrd, male   DOB: 1955-10-21, 63 y.o.   MRN: [id number] Vital signs are stable Patient notes that the Percocet gives him pickups Still having a minor amount of soreness in his back We will plan discharge for the morning We will change him over to hydrocodone

## 2018-08-09 NOTE — Discharge Instructions (Signed)

## 2018-08-09 NOTE — Evaluation (Signed)
Physical Therapy Evaluation and Discharge  Patient Details Name: Ralph Byrd MRN: [id number] DOB: [birth year] Today's Date: 08/09/2018   History of Present Illness  Pt is a 63 y/o male who presents s/p L4-L5 PLIF on 08/08/2018. PMH significant for HTN, CVA 2014, prostate CA s/p prostatectomy, L4-L5 laminectomy 2018.   Clinical Impression  Patient evaluated by Physical Therapy with no further acute PT needs identified. All education has been completed and the patient has no further questions. At the time of PT eval pt was able to perform transfers and ambulation with gross modified independence. Pt was educated on precautions, stair negotiation, brace application/wearing schedule, car transfer, and activity progression. See below for any follow-up Physical Therapy or equipment needs. PT is signing off. Thank you for this referral.     Follow Up Recommendations No PT follow up;Supervision - Intermittent    Equipment Recommendations  None recommended by PT    Recommendations for Other Services       Precautions / Restrictions Precautions Precautions: Fall;Back Precaution Comments: Precautions reviewed with pt verbally and he was cued for precautions during functional mobility.  Required Braces or Orthoses: Spinal Brace Spinal Brace: Lumbar corset;Applied in sitting position Restrictions Weight Bearing Restrictions: No      Mobility  Bed Mobility               General bed mobility comments: Pt sitting up EOB when PT arrived.   Transfers Overall transfer level: Modified independent Equipment used: None             General transfer comment: Pt demonstrated proper hand placement on seated surface for safety. No assist required for balance.   Ambulation/Gait Ambulation/Gait assistance: Modified independent (Device/Increase time) Gait Distance (Feet): 200 Feet Assistive device: None Gait Pattern/deviations: Step-through pattern;Decreased stride length Gait velocity:  Decreased Gait velocity interpretation: 1.31 - 2.62 ft/sec, indicative of limited community ambulator General Gait Details: Slow but generally steady without overt LOB. Pt demonstrating good balance throughout gait training and was able to maintain upright posture without cues.   Stairs Stairs: Yes Stairs assistance: Modified independent (Device/Increase time) Stair Management: One rail Right;Alternating pattern;Step to pattern;Sideways;Forwards Number of Stairs: 10 General stair comments: Pt was instructed on forwards and sideways negotiation of stairs. Pt preferred facing forwards and alternating stairs when ascending and facing sideways utilizing side-step to descend.   Wheelchair Mobility    Modified Rankin (Stroke Patients Only)       Balance Overall balance assessment: Mild deficits observed, not formally tested                                           Pertinent Vitals/Pain Pain Assessment: Faces Faces Pain Scale: Hurts a little bit Pain Location: Back/incision site Pain Descriptors / Indicators: Operative site guarding Pain Intervention(s): Limited activity within patient's tolerance;Monitored during session;Repositioned    Home Living Family/patient expects to be discharged to:: Private residence Living Arrangements: Spouse/significant other Available Help at Discharge: Family;Available PRN/intermittently Type of Home: Apartment Home Access: Stairs to enter Entrance Stairs-Rails: Right;Left Entrance Stairs-Number of Steps: 13 Home Layout: One level Home Equipment: Cane - quad      Prior Function Level of Independence: Independent               Hand Dominance        Extremity/Trunk Assessment   Upper Extremity Assessment Upper Extremity Assessment: Defer to OT  evaluation    Lower Extremity Assessment Lower Extremity Assessment: Generalized weakness(Consistent with pre-op diagnosis)    Cervical / Trunk Assessment Cervical /  Trunk Assessment: Other exceptions Cervical / Trunk Exceptions: s/p surgery  Communication   Communication: No difficulties(Minor language barrier at times but grossly no difficulties)  Cognition Arousal/Alertness: Awake/alert Behavior During Therapy: WFL for tasks assessed/performed Overall Cognitive Status: Within Functional Limits for tasks assessed                                        General Comments      Exercises     Assessment/Plan    PT Assessment Patent does not need any further PT services  PT Problem List         PT Treatment Interventions      PT Goals (Current goals can be found in the Care Plan section)  Acute Rehab PT Goals Patient Stated Goal: Home today PT Goal Formulation: All assessment and education complete, DC therapy    Frequency     Barriers to discharge        Co-evaluation               AM-PAC PT "6 Clicks" Mobility  Outcome Measure Help needed turning from your back to your side while in a flat bed without using bedrails?: None Help needed moving from lying on your back to sitting on the side of a flat bed without using bedrails?: None Help needed moving to and from a bed to a chair (including a wheelchair)?: None Help needed standing up from a chair using your arms (e.g., wheelchair or bedside chair)?: None Help needed to walk in hospital room?: None Help needed climbing 3-5 steps with a railing? : None 6 Click Score: 24    End of Session Equipment Utilized During Treatment: Back brace Activity Tolerance: Patient tolerated treatment well Patient left: with call bell/phone within reach;Other (comment)(Sitting EOB awaiting OT) Nurse Communication: Mobility status PT Visit Diagnosis: Unsteadiness on feet (R26.81);Pain Pain - part of body: (back)    Time: 5945-8592 PT Time Calculation (min) (ACUTE ONLY): 23 min   Charges:   PT Evaluation $PT Eval Low Complexity: 1 Low PT Treatments $Gait Training: 8-22  mins        Rolinda Roan, PT, DPT Acute Rehabilitation Services Pager: [phone] Office: [phone]   Thelma Comp 08/09/2018, 8:47 AM

## 2018-08-09 NOTE — Progress Notes (Signed)
OT Evaluation  Completed all education regarding back precautions with use of compensatory techniques and AE. Pt able to return demonstrate. OT signing off.     08/09/18 0900  OT Visit Information  Last OT Received On 08/09/18  Assistance Needed +1  History of Present Illness Pt is a 63 y/o male who presents s/p L4-L5 PLIF on 08/08/2018. PMH significant for HTN, CVA 2014, prostate CA s/p prostatectomy, L4-L5 laminectomy 2018.   Precautions  Precautions Fall;Back  Precaution Booklet Issued Yes (comment)  Precaution Comments Precautions reviewed with pt verbally and he was cued for precautions during functional mobility.   Required Braces or Orthoses Spinal Brace  Spinal Brace Lumbar corset;Applied in sitting position  Restrictions  Weight Bearing Restrictions No  Home Living  Family/patient expects to be discharged to: Private residence  Living Arrangements Spouse/significant other  Available Help at Discharge Family;Available PRN/intermittently  Type of Home Apartment  Home Access Stairs to enter  Entrance Stairs-Number of Steps 13  Entrance Stairs-Rails Right;Left  Home Layout One level  Bathroom Biomedical scientist Yes  How Accessible Accessible via walker  [place] - quad  Prior Function  Level of Independence Independent  Comments works as Secretary/administrator at [employer] No difficulties (Minor language barrier at times but grossly no difficulties)  Pain Assessment  Pain Assessment 0-10  Pain Score 6  Pain Location Back/incision site  Pain Descriptors / Indicators Operative site guarding  Pain Intervention(s) Limited activity within patient's tolerance  Cognition  Arousal/Alertness Awake/alert  Behavior During Therapy WFL for tasks assessed/performed  Overall Cognitive Status Within Functional Limits for tasks assessed  Upper Extremity Assessment  Upper Extremity Assessment Overall WFL  for tasks assessed  Lower Extremity Assessment  Lower Extremity Assessment Defer to PT evaluation  Cervical / Trunk Assessment  Cervical / Trunk Assessment Other exceptions  Cervical / Trunk Exceptions s/p surgery  ADL  Overall ADL's  Needs assistance/impaired  Functional mobility during ADLs Modified independent  General ADL Comments Educated pt on compensatory technqiues and use of AE for LB ADL as needed. Pt benefits from a long handled sponge and long handled shoe horn. Educated pt on proper techniqeus for hygiene after toileting, bathing and grooming. Pt verbalized understanding.   Bed Mobility  General bed mobility comments Pt sitting up EOB when PT arrived.   Transfers  Overall transfer level Modified independent  Equipment used None  General transfer comment Pt demonstrated proper hand placement on seated surface for safety. No assist required for balance.   Balance  Overall balance assessment Modified Independent  OT - End of Session  Equipment Utilized During Treatment Back brace  Activity Tolerance Patient tolerated treatment well  Patient left in chair;with call bell/phone within reach  Nurse Communication Mobility status  OT Assessment  OT Recommendation/Assessment Patient does not need any further OT services  OT Visit Diagnosis Pain  Pain - part of body  (back)  OT Problem List Decreased range of motion;Decreased knowledge of use of DME or AE;Decreased knowledge of precautions;Pain  AM-PAC OT "6 Clicks" Daily Activity Outcome Measure (Version 2)  Help from another person eating meals? 4  Help from another person taking care of personal grooming? 4  Help from another person toileting, which includes using toliet, bedpan, or urinal? 3  Help from another person bathing (including washing, rinsing, drying)? 3  Help from another person to put on and taking off regular upper body clothing? 4  Help from another person to put on and taking off regular lower body clothing? 3   6 Click Score 21  OT Recommendation  Follow Up Recommendations No OT follow up;Supervision - Intermittent  OT Equipment None recommended by OT  Acute Rehab OT Goals  Patient Stated Goal Home today  OT Goal Formulation All assessment and education complete, DC therapy  OT Time Calculation  OT Start Time (ACUTE ONLY) 0815  OT Stop Time (ACUTE ONLY) 0840  OT Time Calculation (min) 25 min  OT General Charges  $OT Visit 1 Visit  OT Evaluation  $OT Eval Low Complexity 1 Low  OT Treatments  $Self Care/Home Management  8-22 mins  Written Expression  Dominant Hand Right  Maurie Boettcher, OT/L   Acute OT Clinical Specialist Acute Rehabilitation Services Pager [phone] Office [phone]

## 2018-08-09 NOTE — Discharge Summary (Signed)
Physician Discharge Summary  Patient ID: Ralph Byrd MRN: [id number] DOB/AGE: 08-20-1955 63 y.o.  Admit date: 08/08/2018 Discharge date: 08/10/2018  Admission Diagnoses: Spondylo-listhesis L4-5 with severe radiculopathy  Discharge Diagnoses: Spondylolisthesis L4-5 with severe radiculopathy Active Problems:   Spondylolisthesis at L4-L5 level   Discharged Condition: good  Hospital Course: Patient was admitted to undergo surgical decompression at L4-L5 which she tolerated well.  Consults: None  Significant Diagnostic Studies: None  Treatments: surgery: Laminectomy L4-L5 with posterior lumbar interbody arthrodesis pedicle screw fixation L4-L5.  Discharge Exam: Blood pressure 136/80, pulse 84, temperature 98.5 F (36.9 C), temperature source Oral, resp. rate 17, height 5\' 10"  (1.778 m), weight 87.5 kg, SpO2 97 %. Incisions clean and dry motor function is intact patient and gait are normal  Disposition: Discharge disposition: 01-Home or Self Care       Discharge Instructions    Call MD for:  redness, tenderness, or signs of infection (pain, swelling, redness, odor or green/yellow discharge around incision site)   Complete by:  As directed    Call MD for:  severe uncontrolled pain   Complete by:  As directed    Call MD for:  temperature >100.4   Complete by:  As directed    Diet - low sodium heart healthy   Complete by:  As directed    Discharge instructions   Complete by:  As directed    Okay to shower. Do not apply salves or appointments to incision. No heavy lifting with the upper extremities greater than 15 pounds. May resume driving when not requiring pain medication and patient feels comfortable with doing so.   Incentive spirometry RT   Complete by:  As directed    Increase activity slowly   Complete by:  As directed      Allergies as of 08/09/2018   No Known Allergies     Medication List    TAKE these medications   amLODipine 5 MG tablet Commonly known  as:  NORVASC Take 5 mg by mouth daily.   aspirin 325 MG EC tablet Take 325 mg by mouth daily.   clopidogrel 75 MG tablet Commonly known as:  PLAVIX Take 75 mg by mouth daily.   gabapentin 300 MG capsule Commonly known as:  NEURONTIN Take 300 mg by mouth 2 (two) times daily.   hydrochlorothiazide 25 MG tablet Commonly known as:  HYDRODIURIL Take 25 mg by mouth daily.   HYDROcodone-acetaminophen 5-325 MG tablet Commonly known as:  NORCO/VICODIN Take 1-2 tablets by mouth every 4 (four) hours as needed for moderate pain or severe pain.   methocarbamol 500 MG tablet Commonly known as:  ROBAXIN Take 1 tablet (500 mg total) by mouth every 6 (six) hours as needed for muscle spasms.   multivitamin with minerals Tabs tablet Take 1 tablet by mouth daily.   pravastatin 20 MG tablet Commonly known as:  PRAVACHOL Take 20 mg by mouth at bedtime.   traMADol 50 MG tablet Commonly known as:  ULTRAM Take 50 mg by mouth daily.        Signed: Earleen Newport 08/09/2018, 5:44 PM

## 2018-08-10 MED FILL — HYDROCODON-APAP 5-325: 5-325 | 5 days supply | Qty: 60 | Fill #0

## 2018-08-10 MED FILL — METHOCARBAMOL 500 MG TABS: 500 | 10 days supply | Qty: 40 | Fill #0

## 2018-08-10 NOTE — Progress Notes (Signed)
Patient is discharged from room 3C07 at this time. Alert and in stable condition. IV site d/c'd and instructions read to patient and spouse with understanding verbalized. Left unit via wheelchair with all belongings at side. 

## 2018-08-11 ENCOUNTER — Encounter: Payer: Self-pay | Admitting: Physical Therapy

## 2018-08-11 ENCOUNTER — Other Ambulatory Visit: Payer: Self-pay | Admitting: *Deleted

## 2018-08-11 DIAGNOSIS — I10 Essential (primary) hypertension: Secondary | ICD-10-CM

## 2018-08-11 DIAGNOSIS — I639 Cerebral infarction, unspecified: Secondary | ICD-10-CM

## 2018-08-11 DIAGNOSIS — E785 Hyperlipidemia, unspecified: Secondary | ICD-10-CM

## 2018-08-11 DIAGNOSIS — C61 Malignant neoplasm of prostate: Secondary | ICD-10-CM

## 2018-08-11 DIAGNOSIS — R7303 Prediabetes: Secondary | ICD-10-CM

## 2018-08-11 NOTE — Patient Outreach (Signed)
[facility]) Care Management  08/11/2018  OPIE MACLAUGHLIN 03/28/1956 [id number]   Transition of care call   Referral received:08/09/18 Initial outreach: 08/11/18 Insurance:[employer]   Subjective: Initial successful telephone call to patient's preferred number in order to complete transition of care assessment; 2 HIPAA identifiers verified. Explained purpose of call and completed transition of care assessment.  Mr. Fye states he is in considerable pain but was willing to complete transition of care assessment.  He also states he has had hiccoughs since his discharge and this is adding to his back pain.     Objective:  Mr. Sollenberger  was hospitalized at [facility] from 1/27-1/28/2020 for L4-L5 post lumbar interbody fusion. Comorbidities include: HTN, hyperlipidemia, CVA, prediabetes, and prostate cancer He was discharged to home on 08/10/2018 without the need for home health services or DME.   Assessment:  See transition of care flowsheet for assessment details.   Plan:  Reviewed home remedies to treat hiccoughs. When reviewing medication list per discharge instructions, Mr. Pringle states he was not taking gabapentin so advised him to resume. Also advised him to call surgeon's office if his pain remains unrelieved despite taking the hydrocodone, methocarbamol, and gabapentin as directed and/or if his hiccoughs continue despite home remedies. No ongoing care management needs identified so will close case to [facility] care management services and route successful outreach letter with [facility] pamphlet and 24 Hour Nurse Line Magnet to [facility] clinical pool to be mailed to patient's home address.   Barrington Ellison RN,CCM,CDE [facility] Coordinator Office Phone [phone] Office Fax [phone]

## 2018-08-24 MED FILL — HYDROCODON-APAP 5-325: 5-325 | 10 days supply | Qty: 40 | Fill #0

## 2018-08-25 MED FILL — METHOCARBAMOL 500 MG TABS: 500 | 10 days supply | Qty: 40 | Fill #1

## 2018-08-31 MED FILL — HYDROCHLOROTHIAZIDE 25 MG T: 25 | 90 days supply | Qty: 90 | Fill #3

## 2018-08-31 MED FILL — GABAPENTIN 300 MG CAPSULE: 300 | 90 days supply | Qty: 180 | Fill #1

## 2018-09-03 MED FILL — traMADol HCL 50 MG TABS: 50 | 10 days supply | Qty: 60 | Fill #0

## 2018-09-14 MED FILL — METHOCARBAMOL 500 MG TABS: 500 | 10 days supply | Qty: 40 | Fill #2

## 2018-09-28 MED FILL — METHOCARBAMOL 500 MG TABS: 500 | 10 days supply | Qty: 40 | Fill #3

## 2018-09-28 MED FILL — AMLODIPINE BESYLATE 5 MG TA: 5 | 90 days supply | Qty: 90 | Fill #3

## 2018-09-28 MED FILL — CLOPIDOGREL 75 MG TABLET: 75 | 90 days supply | Qty: 90 | Fill #3

## 2018-10-17 MED FILL — PRAVASTATIN SODIUM 20 MG TA: 20 | 90 days supply | Qty: 90 | Fill #0

## 2018-11-02 MED FILL — MELOXICAM 15 MG TABLET: 15 | 30 days supply | Qty: 30 | Fill #0

## 2018-12-26 MED FILL — HYDROCHLOROTHIAZIDE 25 MG T: 25 | 90 days supply | Qty: 90 | Fill #0

## 2018-12-26 MED FILL — PRAVASTATIN SODIUM 20 MG TA: 20 | 90 days supply | Qty: 90 | Fill #0

## 2018-12-26 MED FILL — AMLODIPINE BESYLATE 5 MG TA: 5 | 90 days supply | Qty: 90 | Fill #0

## 2018-12-26 MED FILL — CLOPIDOGREL 75 MG TABLET: 75 | 90 days supply | Qty: 90 | Fill #0

## 2019-03-24 MED FILL — CLOPIDOGREL 75 MG TABLET: 75 | 90 days supply | Qty: 90 | Fill #1

## 2019-03-24 MED FILL — AMLODIPINE BESYLATE 5 MG TA: 5 | 90 days supply | Qty: 90 | Fill #1

## 2019-03-24 MED FILL — HYDROCHLOROTHIAZIDE 25 MG T: 25 | 90 days supply | Qty: 90 | Fill #1

## 2019-03-24 MED FILL — PRAVASTATIN SODIUM 20 MG TA: 20 | 90 days supply | Qty: 90 | Fill #1

## 2019-05-05 MED FILL — METHOCARBAMOL 500 MG TABS: 500 | 10 days supply | Qty: 40 | Fill #0

## 2019-06-20 MED FILL — HYDROCHLOROTHIAZIDE 25 MG T: 25 | 90 days supply | Qty: 90 | Fill #0

## 2019-06-20 MED FILL — CLOPIDOGREL 75 MG TABLET: 75 | 90 days supply | Qty: 90 | Fill #0

## 2019-06-20 MED FILL — PRAVASTATIN SODIUM 20 MG TA: 20 | 90 days supply | Qty: 90 | Fill #0

## 2019-06-20 MED FILL — AMLODIPINE BESYLATE 5 MG TA: 5 | 90 days supply | Qty: 90 | Fill #0

## 2019-09-14 MED FILL — CLOPIDOGREL 75 MG TABLET: 75 | 90 days supply | Qty: 90 | Fill #1

## 2019-09-14 MED FILL — PRAVASTATIN SODIUM 20 MG TA: 20 | 90 days supply | Qty: 90 | Fill #1

## 2019-09-14 MED FILL — HYDROCHLOROTHIAZIDE 25 MG T: 25 | 90 days supply | Qty: 90 | Fill #1

## 2019-09-14 MED FILL — AMLODIPINE BESYLATE 5 MG TA: 5 | 90 days supply | Qty: 90 | Fill #1

## 2019-10-23 ENCOUNTER — Other Ambulatory Visit (HOSPITAL_COMMUNITY): Payer: Self-pay | Admitting: Family Medicine

## 2019-12-13 MED FILL — CLOPIDOGREL 75 MG TABLET: 75 | 90 days supply | Qty: 90 | Fill #0

## 2019-12-13 MED FILL — AMLODIPINE BESYLATE 5 MG TA: 5 | 90 days supply | Qty: 90 | Fill #0

## 2019-12-13 MED FILL — HYDROCHLOROTHIAZIDE 25 MG T: 25 | 90 days supply | Qty: 90 | Fill #0

## 2019-12-13 MED FILL — PRAVASTATIN SODIUM 20 MG TA: 20 | 90 days supply | Qty: 90 | Fill #0

## 2020-04-01 MED FILL — CLOPIDOGREL 75 MG TABLET: 75 | 90 days supply | Qty: 90 | Fill #1

## 2020-04-01 MED FILL — HYDROCHLOROTHIAZIDE 25 MG T: 25 | 90 days supply | Qty: 90 | Fill #1

## 2020-04-01 MED FILL — AMLODIPINE BESYLATE 5 MG TA: 5 | 90 days supply | Qty: 90 | Fill #1

## 2020-04-01 MED FILL — PRAVASTATIN SODIUM 20 MG TA: 20 | 90 days supply | Qty: 90 | Fill #1

## 2020-05-09 ENCOUNTER — Other Ambulatory Visit (HOSPITAL_COMMUNITY): Payer: Self-pay | Admitting: Family Medicine

## 2020-06-03 ENCOUNTER — Other Ambulatory Visit (HOSPITAL_COMMUNITY): Payer: Self-pay | Admitting: Gastroenterology

## 2020-06-03 MED FILL — PEG-3350 SOLUTION: 420 | 1 days supply | Qty: 4000 | Fill #0

## 2020-07-09 MED FILL — CLOPIDOGREL 75 MG TABLET: 75 | 90 days supply | Qty: 90 | Fill #2

## 2020-07-09 MED FILL — PRAVASTATIN SODIUM 20 MG TA: 20 | 90 days supply | Qty: 90 | Fill #2

## 2020-07-09 MED FILL — AMLODIPINE BESYLATE 5 MG TA: 5 | 90 days supply | Qty: 90 | Fill #2

## 2020-07-09 MED FILL — HYDROCHLOROTHIAZIDE 25 MG T: 25 | 90 days supply | Qty: 90 | Fill #2

## 2020-08-08 DIAGNOSIS — Z01812 Encounter for preprocedural laboratory examination: Secondary | ICD-10-CM | POA: Diagnosis not present

## 2020-08-13 DIAGNOSIS — Z86010 Personal history of colonic polyps: Secondary | ICD-10-CM | POA: Diagnosis not present

## 2020-10-03 ENCOUNTER — Other Ambulatory Visit (HOSPITAL_COMMUNITY): Payer: Self-pay | Admitting: Family Medicine

## 2020-10-03 MED FILL — HYDROCHLOROTHIAZIDE 25 MG T: 25 | 90 days supply | Qty: 90 | Fill #0

## 2020-10-03 MED FILL — PRAVASTATIN SODIUM 20 MG TA: 20 | 90 days supply | Qty: 90 | Fill #0

## 2020-10-03 MED FILL — CLOPIDOGREL 75 MG TABLET: 75 | 90 days supply | Qty: 90 | Fill #0

## 2020-10-03 MED FILL — AMLODIPINE BESYLATE 5 MG TA: 5 | 90 days supply | Qty: 90 | Fill #0

## 2020-10-18 ENCOUNTER — Other Ambulatory Visit (HOSPITAL_COMMUNITY): Payer: Self-pay

## 2020-11-01 ENCOUNTER — Other Ambulatory Visit (HOSPITAL_COMMUNITY): Payer: Self-pay

## 2020-11-01 DIAGNOSIS — Z23 Encounter for immunization: Secondary | ICD-10-CM | POA: Diagnosis not present

## 2020-11-01 DIAGNOSIS — I10 Essential (primary) hypertension: Secondary | ICD-10-CM | POA: Diagnosis not present

## 2020-11-01 DIAGNOSIS — E7800 Pure hypercholesterolemia, unspecified: Secondary | ICD-10-CM | POA: Diagnosis not present

## 2020-11-01 DIAGNOSIS — Z8673 Personal history of transient ischemic attack (TIA), and cerebral infarction without residual deficits: Secondary | ICD-10-CM | POA: Diagnosis not present

## 2020-11-01 DIAGNOSIS — E1169 Type 2 diabetes mellitus with other specified complication: Secondary | ICD-10-CM | POA: Diagnosis not present

## 2020-11-01 DIAGNOSIS — Z0000 Encounter for general adult medical examination without abnormal findings: Secondary | ICD-10-CM | POA: Diagnosis not present

## 2020-11-01 DIAGNOSIS — K0889 Other specified disorders of teeth and supporting structures: Secondary | ICD-10-CM | POA: Diagnosis not present

## 2020-11-01 MED ORDER — PRAVASTATIN SODIUM 20 MG PO TABS
20.0000 mg | ORAL_TABLET | Freq: Every day | ORAL | 2 refills | Status: AC
  Filled 2020-11-01: qty 90, 90d supply, fill #0

## 2020-11-01 MED ORDER — HYDROCHLOROTHIAZIDE 25 MG PO TABS
25.0000 mg | ORAL_TABLET | Freq: Every day | ORAL | 2 refills | Status: AC
  Filled 2020-11-01: qty 90, 90d supply, fill #0

## 2020-11-01 MED ORDER — CLOPIDOGREL BISULFATE 75 MG PO TABS
75.0000 mg | ORAL_TABLET | Freq: Every day | ORAL | 3 refills | Status: AC
  Filled 2020-11-01: qty 90, 90d supply, fill #0

## 2020-11-01 MED ORDER — AMLODIPINE BESYLATE 5 MG PO TABS
5.0000 mg | ORAL_TABLET | Freq: Every day | ORAL | 2 refills | Status: AC
  Filled 2020-11-01: qty 90, 90d supply, fill #0

## 2020-11-06 ENCOUNTER — Other Ambulatory Visit (HOSPITAL_COMMUNITY): Payer: Self-pay

## 2020-11-06 DIAGNOSIS — R6889 Other general symptoms and signs: Secondary | ICD-10-CM | POA: Diagnosis not present

## 2020-11-06 MED ORDER — AMOXICILLIN 500 MG PO CAPS
ORAL_CAPSULE | ORAL | 1 refills | Status: AC
  Filled 2020-11-06: qty 21, 6d supply, fill #0

## 2020-11-13 ENCOUNTER — Other Ambulatory Visit (HOSPITAL_COMMUNITY): Payer: Self-pay

## 2020-11-13 DIAGNOSIS — H04123 Dry eye syndrome of bilateral lacrimal glands: Secondary | ICD-10-CM | POA: Diagnosis not present

## 2020-11-13 DIAGNOSIS — H40013 Open angle with borderline findings, low risk, bilateral: Secondary | ICD-10-CM | POA: Diagnosis not present

## 2020-11-13 DIAGNOSIS — H43811 Vitreous degeneration, right eye: Secondary | ICD-10-CM | POA: Diagnosis not present

## 2020-11-13 DIAGNOSIS — H25813 Combined forms of age-related cataract, bilateral: Secondary | ICD-10-CM | POA: Diagnosis not present

## 2020-11-13 DIAGNOSIS — E119 Type 2 diabetes mellitus without complications: Secondary | ICD-10-CM | POA: Diagnosis not present

## 2020-11-13 DIAGNOSIS — H1045 Other chronic allergic conjunctivitis: Secondary | ICD-10-CM | POA: Diagnosis not present

## 2020-11-13 MED ORDER — LATANOPROST 0.005 % OP SOLN
1.0000 [drp] | Freq: Every day | OPHTHALMIC | 11 refills | Status: AC
  Filled 2020-11-13: qty 2.5, 50d supply, fill #0
  Filled 2020-12-17: qty 2.5, 50d supply, fill #1
  Filled 2021-02-03: qty 2.5, 50d supply, fill #2
  Filled 2021-03-10: qty 2.5, 38d supply, fill #3
  Filled 2021-04-14: qty 2.5, 38d supply, fill #4
  Filled 2021-05-15: qty 2.5, 38d supply, fill #5
  Filled 2021-06-25: qty 2.5, 38d supply, fill #6

## 2020-12-17 ENCOUNTER — Other Ambulatory Visit (HOSPITAL_COMMUNITY): Payer: Self-pay

## 2020-12-18 ENCOUNTER — Other Ambulatory Visit (HOSPITAL_COMMUNITY): Payer: Self-pay

## 2020-12-25 ENCOUNTER — Other Ambulatory Visit (HOSPITAL_COMMUNITY): Payer: Self-pay

## 2020-12-25 DIAGNOSIS — H40013 Open angle with borderline findings, low risk, bilateral: Secondary | ICD-10-CM | POA: Diagnosis not present

## 2021-01-08 ENCOUNTER — Other Ambulatory Visit (HOSPITAL_COMMUNITY): Payer: Self-pay

## 2021-01-08 MED FILL — Amlodipine Besylate Tab 5 MG (Base Equivalent): ORAL | 90 days supply | Qty: 90 | Fill #0 | Status: AC

## 2021-01-08 MED FILL — Pravastatin Sodium Tab 20 MG: ORAL | 90 days supply | Qty: 90 | Fill #0 | Status: AC

## 2021-01-08 MED FILL — Clopidogrel Bisulfate Tab 75 MG (Base Equiv): ORAL | 90 days supply | Qty: 90 | Fill #0 | Status: AC

## 2021-01-08 MED FILL — Hydrochlorothiazide Tab 25 MG: ORAL | 90 days supply | Qty: 90 | Fill #0 | Status: AC

## 2021-02-03 ENCOUNTER — Other Ambulatory Visit (HOSPITAL_COMMUNITY): Payer: Self-pay

## 2021-03-10 ENCOUNTER — Other Ambulatory Visit (HOSPITAL_COMMUNITY): Payer: Self-pay

## 2021-04-14 ENCOUNTER — Other Ambulatory Visit (HOSPITAL_COMMUNITY): Payer: Self-pay

## 2021-04-14 MED FILL — Clopidogrel Bisulfate Tab 75 MG (Base Equiv): ORAL | 90 days supply | Qty: 90 | Fill #1 | Status: AC

## 2021-04-14 MED FILL — Pravastatin Sodium Tab 20 MG: ORAL | 90 days supply | Qty: 90 | Fill #1 | Status: AC

## 2021-04-14 MED FILL — Amlodipine Besylate Tab 5 MG (Base Equivalent): ORAL | 90 days supply | Qty: 90 | Fill #1 | Status: AC

## 2021-04-14 MED FILL — Hydrochlorothiazide Tab 25 MG: ORAL | 90 days supply | Qty: 90 | Fill #1 | Status: AC

## 2021-04-15 ENCOUNTER — Other Ambulatory Visit (HOSPITAL_COMMUNITY): Payer: Self-pay

## 2021-05-05 ENCOUNTER — Other Ambulatory Visit (HOSPITAL_COMMUNITY): Payer: Self-pay

## 2021-05-05 DIAGNOSIS — E1169 Type 2 diabetes mellitus with other specified complication: Secondary | ICD-10-CM | POA: Diagnosis not present

## 2021-05-05 DIAGNOSIS — Z8673 Personal history of transient ischemic attack (TIA), and cerebral infarction without residual deficits: Secondary | ICD-10-CM | POA: Diagnosis not present

## 2021-05-05 DIAGNOSIS — I10 Essential (primary) hypertension: Secondary | ICD-10-CM | POA: Diagnosis not present

## 2021-05-05 DIAGNOSIS — Z23 Encounter for immunization: Secondary | ICD-10-CM | POA: Diagnosis not present

## 2021-05-05 DIAGNOSIS — E7800 Pure hypercholesterolemia, unspecified: Secondary | ICD-10-CM | POA: Diagnosis not present

## 2021-05-05 MED ORDER — PRAVASTATIN SODIUM 20 MG PO TABS
20.0000 mg | ORAL_TABLET | Freq: Every day | ORAL | 2 refills | Status: AC
  Filled 2021-05-05 – 2021-08-04 (×2): qty 90, 90d supply, fill #0
  Filled 2021-09-29 – 2021-10-13 (×2): qty 90, 90d supply, fill #1

## 2021-05-05 MED ORDER — AMLODIPINE BESYLATE 5 MG PO TABS
5.0000 mg | ORAL_TABLET | Freq: Every day | ORAL | 2 refills | Status: AC
  Filled 2021-05-05 – 2021-08-04 (×2): qty 90, 90d supply, fill #0
  Filled 2021-09-29 – 2021-10-13 (×2): qty 90, 90d supply, fill #1

## 2021-05-05 MED ORDER — HYDROCHLOROTHIAZIDE 25 MG PO TABS
25.0000 mg | ORAL_TABLET | Freq: Every day | ORAL | 2 refills | Status: AC
  Filled 2021-05-05 – 2021-08-04 (×2): qty 90, 90d supply, fill #0
  Filled 2021-09-29 – 2021-10-13 (×2): qty 90, 90d supply, fill #1

## 2021-05-05 MED ORDER — CLOPIDOGREL BISULFATE 75 MG PO TABS
75.0000 mg | ORAL_TABLET | Freq: Every day | ORAL | 3 refills | Status: AC
  Filled 2021-05-05 – 2021-08-04 (×2): qty 90, 90d supply, fill #0
  Filled 2021-09-29 – 2021-10-13 (×2): qty 90, 90d supply, fill #1

## 2021-05-15 ENCOUNTER — Other Ambulatory Visit (HOSPITAL_COMMUNITY): Payer: Self-pay

## 2021-06-16 ENCOUNTER — Other Ambulatory Visit (HOSPITAL_COMMUNITY): Payer: Self-pay

## 2021-06-25 ENCOUNTER — Other Ambulatory Visit (HOSPITAL_COMMUNITY): Payer: Self-pay

## 2021-06-25 DIAGNOSIS — H04203 Unspecified epiphora, bilateral lacrimal glands: Secondary | ICD-10-CM | POA: Diagnosis not present

## 2021-06-25 DIAGNOSIS — H40013 Open angle with borderline findings, low risk, bilateral: Secondary | ICD-10-CM | POA: Diagnosis not present

## 2021-06-25 DIAGNOSIS — H04413 Chronic dacryocystitis of bilateral lacrimal passages: Secondary | ICD-10-CM | POA: Diagnosis not present

## 2021-06-25 MED ORDER — POLYMYXIN B-TRIMETHOPRIM 10000-0.1 UNIT/ML-% OP SOLN
1.0000 [drp] | Freq: Four times a day (QID) | OPHTHALMIC | 1 refills | Status: AC
  Filled 2021-06-25: qty 10, 25d supply, fill #0

## 2021-08-04 ENCOUNTER — Other Ambulatory Visit (HOSPITAL_COMMUNITY): Payer: Self-pay

## 2021-08-06 DIAGNOSIS — H40013 Open angle with borderline findings, low risk, bilateral: Secondary | ICD-10-CM | POA: Diagnosis not present

## 2021-08-06 DIAGNOSIS — H04413 Chronic dacryocystitis of bilateral lacrimal passages: Secondary | ICD-10-CM | POA: Diagnosis not present

## 2021-08-06 DIAGNOSIS — H04203 Unspecified epiphora, bilateral lacrimal glands: Secondary | ICD-10-CM | POA: Diagnosis not present

## 2021-09-29 ENCOUNTER — Other Ambulatory Visit (HOSPITAL_COMMUNITY): Payer: Self-pay

## 2021-10-13 ENCOUNTER — Other Ambulatory Visit (HOSPITAL_COMMUNITY): Payer: Self-pay

## 2021-11-13 ENCOUNTER — Other Ambulatory Visit (HOSPITAL_COMMUNITY): Payer: Self-pay

## 2021-11-13 DIAGNOSIS — M48061 Spinal stenosis, lumbar region without neurogenic claudication: Secondary | ICD-10-CM | POA: Diagnosis not present

## 2021-11-13 DIAGNOSIS — E7800 Pure hypercholesterolemia, unspecified: Secondary | ICD-10-CM | POA: Diagnosis not present

## 2021-11-13 DIAGNOSIS — I10 Essential (primary) hypertension: Secondary | ICD-10-CM | POA: Diagnosis not present

## 2021-11-13 DIAGNOSIS — Z0000 Encounter for general adult medical examination without abnormal findings: Secondary | ICD-10-CM | POA: Diagnosis not present

## 2021-11-13 DIAGNOSIS — H409 Unspecified glaucoma: Secondary | ICD-10-CM | POA: Diagnosis not present

## 2021-11-13 DIAGNOSIS — Z8673 Personal history of transient ischemic attack (TIA), and cerebral infarction without residual deficits: Secondary | ICD-10-CM | POA: Diagnosis not present

## 2021-11-13 DIAGNOSIS — E1169 Type 2 diabetes mellitus with other specified complication: Secondary | ICD-10-CM | POA: Diagnosis not present

## 2021-11-13 MED ORDER — PRAVASTATIN SODIUM 20 MG PO TABS
20.0000 mg | ORAL_TABLET | Freq: Every day | ORAL | 2 refills | Status: AC
  Filled 2021-11-13 – 2022-01-19 (×2): qty 90, 90d supply, fill #0
  Filled 2022-04-15: qty 90, 90d supply, fill #1

## 2021-11-13 MED ORDER — CLOPIDOGREL BISULFATE 75 MG PO TABS
75.0000 mg | ORAL_TABLET | Freq: Every day | ORAL | 2 refills | Status: AC
  Filled 2021-11-13 – 2022-01-19 (×2): qty 90, 90d supply, fill #0
  Filled 2022-04-15: qty 90, 90d supply, fill #1

## 2021-11-13 MED ORDER — AMLODIPINE BESYLATE 5 MG PO TABS
5.0000 mg | ORAL_TABLET | Freq: Every day | ORAL | 2 refills | Status: AC
  Filled 2021-11-13 – 2022-01-19 (×2): qty 90, 90d supply, fill #0
  Filled 2022-04-15: qty 90, 90d supply, fill #1

## 2021-11-13 MED ORDER — HYDROCHLOROTHIAZIDE 25 MG PO TABS
25.0000 mg | ORAL_TABLET | Freq: Every day | ORAL | 2 refills | Status: AC
  Filled 2021-11-13 – 2022-01-19 (×2): qty 90, 90d supply, fill #0
  Filled 2022-04-15: qty 90, 90d supply, fill #1

## 2021-12-24 ENCOUNTER — Other Ambulatory Visit (HOSPITAL_COMMUNITY): Payer: Self-pay

## 2021-12-24 DIAGNOSIS — H1045 Other chronic allergic conjunctivitis: Secondary | ICD-10-CM | POA: Diagnosis not present

## 2021-12-24 DIAGNOSIS — H04203 Unspecified epiphora, bilateral lacrimal glands: Secondary | ICD-10-CM | POA: Diagnosis not present

## 2021-12-24 DIAGNOSIS — H04123 Dry eye syndrome of bilateral lacrimal glands: Secondary | ICD-10-CM | POA: Diagnosis not present

## 2021-12-24 DIAGNOSIS — E119 Type 2 diabetes mellitus without complications: Secondary | ICD-10-CM | POA: Diagnosis not present

## 2021-12-24 DIAGNOSIS — H04413 Chronic dacryocystitis of bilateral lacrimal passages: Secondary | ICD-10-CM | POA: Diagnosis not present

## 2021-12-24 DIAGNOSIS — H25813 Combined forms of age-related cataract, bilateral: Secondary | ICD-10-CM | POA: Diagnosis not present

## 2021-12-24 DIAGNOSIS — H40013 Open angle with borderline findings, low risk, bilateral: Secondary | ICD-10-CM | POA: Diagnosis not present

## 2021-12-24 DIAGNOSIS — H43811 Vitreous degeneration, right eye: Secondary | ICD-10-CM | POA: Diagnosis not present

## 2021-12-24 MED ORDER — POLYMYXIN B-TRIMETHOPRIM 10000-0.1 UNIT/ML-% OP SOLN
1.0000 [drp] | Freq: Four times a day (QID) | OPHTHALMIC | 0 refills | Status: AC
  Filled 2021-12-24: qty 10, 25d supply, fill #0

## 2022-01-19 ENCOUNTER — Other Ambulatory Visit (HOSPITAL_COMMUNITY): Payer: Self-pay

## 2022-02-27 ENCOUNTER — Other Ambulatory Visit (HOSPITAL_COMMUNITY): Payer: Self-pay

## 2022-02-27 MED ORDER — TRAZODONE HCL 50 MG PO TABS
50.0000 mg | ORAL_TABLET | Freq: Every day | ORAL | 2 refills | Status: AC
  Filled 2022-02-27: qty 30, 30d supply, fill #0

## 2022-04-15 ENCOUNTER — Other Ambulatory Visit (HOSPITAL_COMMUNITY): Payer: Self-pay

## 2022-05-02 DIAGNOSIS — Z23 Encounter for immunization: Secondary | ICD-10-CM | POA: Diagnosis not present

## 2022-05-15 ENCOUNTER — Other Ambulatory Visit (HOSPITAL_COMMUNITY): Payer: Self-pay

## 2022-05-15 DIAGNOSIS — E7800 Pure hypercholesterolemia, unspecified: Secondary | ICD-10-CM | POA: Diagnosis not present

## 2022-05-15 DIAGNOSIS — H409 Unspecified glaucoma: Secondary | ICD-10-CM | POA: Diagnosis not present

## 2022-05-15 DIAGNOSIS — E1169 Type 2 diabetes mellitus with other specified complication: Secondary | ICD-10-CM | POA: Diagnosis not present

## 2022-05-15 DIAGNOSIS — I10 Essential (primary) hypertension: Secondary | ICD-10-CM | POA: Diagnosis not present

## 2022-05-15 DIAGNOSIS — M48061 Spinal stenosis, lumbar region without neurogenic claudication: Secondary | ICD-10-CM | POA: Diagnosis not present

## 2022-05-15 DIAGNOSIS — Z23 Encounter for immunization: Secondary | ICD-10-CM | POA: Diagnosis not present

## 2022-05-15 DIAGNOSIS — Z8673 Personal history of transient ischemic attack (TIA), and cerebral infarction without residual deficits: Secondary | ICD-10-CM | POA: Diagnosis not present

## 2022-05-15 MED ORDER — PRAVASTATIN SODIUM 20 MG PO TABS
20.0000 mg | ORAL_TABLET | Freq: Every day | ORAL | 4 refills | Status: DC
  Filled 2022-07-16: qty 90, 90d supply, fill #0
  Filled 2022-11-13: qty 90, 90d supply, fill #1
  Filled 2023-02-15: qty 90, 90d supply, fill #2
  Filled 2023-04-15 – 2023-04-26 (×2): qty 90, 90d supply, fill #3

## 2022-05-15 MED ORDER — AMLODIPINE BESYLATE 5 MG PO TABS
5.0000 mg | ORAL_TABLET | Freq: Every day | ORAL | 4 refills | Status: DC
  Filled 2022-05-15 – 2022-07-16 (×2): qty 90, 90d supply, fill #0
  Filled 2022-11-13: qty 90, 90d supply, fill #1
  Filled 2023-02-15: qty 90, 90d supply, fill #2
  Filled 2023-04-15 – 2023-04-26 (×2): qty 90, 90d supply, fill #3

## 2022-05-15 MED ORDER — HYDROCHLOROTHIAZIDE 25 MG PO TABS
25.0000 mg | ORAL_TABLET | Freq: Every day | ORAL | 4 refills | Status: DC
  Filled 2022-07-16: qty 90, 90d supply, fill #0
  Filled 2022-11-13: qty 90, 90d supply, fill #1
  Filled 2023-02-15: qty 90, 90d supply, fill #2
  Filled 2023-04-15 – 2023-04-26 (×2): qty 90, 90d supply, fill #3

## 2022-05-15 MED ORDER — CLOPIDOGREL BISULFATE 75 MG PO TABS
75.0000 mg | ORAL_TABLET | Freq: Every day | ORAL | 4 refills | Status: AC
  Filled 2022-07-16: qty 90, 90d supply, fill #0
  Filled 2022-11-13: qty 90, 90d supply, fill #1
  Filled 2023-02-15: qty 90, 90d supply, fill #2
  Filled 2023-04-15 – 2023-04-26 (×2): qty 90, 90d supply, fill #3

## 2022-07-16 ENCOUNTER — Other Ambulatory Visit (HOSPITAL_COMMUNITY): Payer: Self-pay

## 2022-11-13 ENCOUNTER — Other Ambulatory Visit (HOSPITAL_COMMUNITY): Payer: Self-pay

## 2022-11-16 ENCOUNTER — Other Ambulatory Visit (HOSPITAL_COMMUNITY): Payer: Self-pay

## 2022-11-16 DIAGNOSIS — E1169 Type 2 diabetes mellitus with other specified complication: Secondary | ICD-10-CM | POA: Diagnosis not present

## 2022-11-16 DIAGNOSIS — H409 Unspecified glaucoma: Secondary | ICD-10-CM | POA: Diagnosis not present

## 2022-11-16 DIAGNOSIS — I10 Essential (primary) hypertension: Secondary | ICD-10-CM | POA: Diagnosis not present

## 2022-11-16 DIAGNOSIS — M48061 Spinal stenosis, lumbar region without neurogenic claudication: Secondary | ICD-10-CM | POA: Diagnosis not present

## 2022-11-16 DIAGNOSIS — Z8673 Personal history of transient ischemic attack (TIA), and cerebral infarction without residual deficits: Secondary | ICD-10-CM | POA: Diagnosis not present

## 2022-11-16 DIAGNOSIS — Z0000 Encounter for general adult medical examination without abnormal findings: Secondary | ICD-10-CM | POA: Diagnosis not present

## 2022-11-16 DIAGNOSIS — E119 Type 2 diabetes mellitus without complications: Secondary | ICD-10-CM | POA: Diagnosis not present

## 2022-11-16 DIAGNOSIS — E7800 Pure hypercholesterolemia, unspecified: Secondary | ICD-10-CM | POA: Diagnosis not present

## 2023-01-20 DIAGNOSIS — H04123 Dry eye syndrome of bilateral lacrimal glands: Secondary | ICD-10-CM | POA: Diagnosis not present

## 2023-01-20 DIAGNOSIS — H40013 Open angle with borderline findings, low risk, bilateral: Secondary | ICD-10-CM | POA: Diagnosis not present

## 2023-01-20 DIAGNOSIS — H43811 Vitreous degeneration, right eye: Secondary | ICD-10-CM | POA: Diagnosis not present

## 2023-01-20 DIAGNOSIS — H25813 Combined forms of age-related cataract, bilateral: Secondary | ICD-10-CM | POA: Diagnosis not present

## 2023-01-20 DIAGNOSIS — H1045 Other chronic allergic conjunctivitis: Secondary | ICD-10-CM | POA: Diagnosis not present

## 2023-01-20 DIAGNOSIS — E119 Type 2 diabetes mellitus without complications: Secondary | ICD-10-CM | POA: Diagnosis not present

## 2023-02-15 ENCOUNTER — Other Ambulatory Visit (HOSPITAL_COMMUNITY): Payer: Self-pay

## 2023-04-15 ENCOUNTER — Other Ambulatory Visit (HOSPITAL_COMMUNITY): Payer: Self-pay

## 2023-04-23 ENCOUNTER — Other Ambulatory Visit (HOSPITAL_COMMUNITY): Payer: Self-pay

## 2023-04-24 DIAGNOSIS — Z23 Encounter for immunization: Secondary | ICD-10-CM | POA: Diagnosis not present

## 2023-05-18 ENCOUNTER — Other Ambulatory Visit (HOSPITAL_COMMUNITY): Payer: Self-pay

## 2023-05-18 ENCOUNTER — Other Ambulatory Visit: Payer: Self-pay

## 2023-05-18 DIAGNOSIS — Z8673 Personal history of transient ischemic attack (TIA), and cerebral infarction without residual deficits: Secondary | ICD-10-CM | POA: Diagnosis not present

## 2023-05-18 DIAGNOSIS — I10 Essential (primary) hypertension: Secondary | ICD-10-CM | POA: Diagnosis not present

## 2023-05-18 DIAGNOSIS — E119 Type 2 diabetes mellitus without complications: Secondary | ICD-10-CM | POA: Diagnosis not present

## 2023-05-18 DIAGNOSIS — E7800 Pure hypercholesterolemia, unspecified: Secondary | ICD-10-CM | POA: Diagnosis not present

## 2023-05-18 DIAGNOSIS — E1169 Type 2 diabetes mellitus with other specified complication: Secondary | ICD-10-CM | POA: Diagnosis not present

## 2023-05-18 MED ORDER — PRAVASTATIN SODIUM 20 MG PO TABS
20.0000 mg | ORAL_TABLET | Freq: Every day | ORAL | 3 refills | Status: AC
  Filled 2023-09-06: qty 100, 100d supply, fill #0

## 2023-05-18 MED ORDER — AMLODIPINE BESYLATE 5 MG PO TABS
5.0000 mg | ORAL_TABLET | Freq: Every day | ORAL | 3 refills | Status: AC
  Filled 2023-09-06: qty 100, 100d supply, fill #0
  Filled 2023-12-28: qty 100, 100d supply, fill #1

## 2023-05-18 MED ORDER — ASPIRIN 81 MG PO CHEW
81.0000 mg | CHEWABLE_TABLET | Freq: Every day | ORAL | 3 refills | Status: AC
  Filled 2023-05-18: qty 100, 100d supply, fill #0

## 2023-05-18 MED ORDER — SILDENAFIL CITRATE 100 MG PO TABS
50.0000 mg | ORAL_TABLET | ORAL | 11 refills | Status: AC
  Filled 2023-05-18: qty 30, 30d supply, fill #0

## 2023-05-18 MED ORDER — HYDROCHLOROTHIAZIDE 25 MG PO TABS
25.0000 mg | ORAL_TABLET | Freq: Every day | ORAL | 3 refills | Status: AC
  Filled 2023-09-06: qty 100, 100d supply, fill #0
  Filled 2023-12-28: qty 100, 100d supply, fill #1

## 2023-05-20 ENCOUNTER — Other Ambulatory Visit (HOSPITAL_COMMUNITY): Payer: Self-pay

## 2023-05-20 MED ORDER — ROSUVASTATIN CALCIUM 20 MG PO TABS
20.0000 mg | ORAL_TABLET | Freq: Every day | ORAL | 3 refills | Status: AC
  Filled 2023-05-20: qty 100, 100d supply, fill #0
  Filled 2023-09-06: qty 100, 100d supply, fill #1
  Filled 2023-12-28: qty 100, 100d supply, fill #2
  Filled 2024-04-07: qty 100, 100d supply, fill #3

## 2023-05-21 ENCOUNTER — Other Ambulatory Visit (HOSPITAL_COMMUNITY): Payer: Self-pay

## 2023-09-06 ENCOUNTER — Other Ambulatory Visit (HOSPITAL_COMMUNITY): Payer: Self-pay

## 2023-09-08 ENCOUNTER — Other Ambulatory Visit (HOSPITAL_COMMUNITY): Payer: Self-pay

## 2023-09-15 ENCOUNTER — Other Ambulatory Visit (HOSPITAL_COMMUNITY): Payer: Self-pay

## 2023-09-30 ENCOUNTER — Other Ambulatory Visit (HOSPITAL_COMMUNITY): Payer: Self-pay

## 2023-09-30 MED ORDER — AMOXICILLIN 500 MG PO CAPS
500.0000 mg | ORAL_CAPSULE | Freq: Three times a day (TID) | ORAL | 0 refills | Status: AC
  Filled 2023-09-30: qty 21, 7d supply, fill #0

## 2023-11-11 ENCOUNTER — Other Ambulatory Visit (HOSPITAL_COMMUNITY): Payer: Self-pay

## 2023-11-11 DIAGNOSIS — I2510 Atherosclerotic heart disease of native coronary artery without angina pectoris: Secondary | ICD-10-CM | POA: Diagnosis not present

## 2023-11-11 DIAGNOSIS — E118 Type 2 diabetes mellitus with unspecified complications: Secondary | ICD-10-CM | POA: Diagnosis not present

## 2023-11-11 DIAGNOSIS — E782 Mixed hyperlipidemia: Secondary | ICD-10-CM | POA: Diagnosis not present

## 2023-11-11 DIAGNOSIS — Z0000 Encounter for general adult medical examination without abnormal findings: Secondary | ICD-10-CM | POA: Diagnosis not present

## 2023-11-11 DIAGNOSIS — E1169 Type 2 diabetes mellitus with other specified complication: Secondary | ICD-10-CM | POA: Diagnosis not present

## 2023-11-11 DIAGNOSIS — I10 Essential (primary) hypertension: Secondary | ICD-10-CM | POA: Diagnosis not present

## 2023-11-11 MED ORDER — HYDROCHLOROTHIAZIDE 25 MG PO TABS
25.0000 mg | ORAL_TABLET | Freq: Every day | ORAL | 3 refills | Status: AC
  Filled 2023-11-11: qty 90, 90d supply, fill #0
  Filled 2024-04-07: qty 100, 100d supply, fill #0

## 2023-11-11 MED ORDER — ASPIRIN 81 MG PO CHEW
81.0000 mg | CHEWABLE_TABLET | Freq: Every day | ORAL | 3 refills | Status: AC
  Filled 2023-11-11: qty 90, 90d supply, fill #0

## 2023-11-11 MED ORDER — AMLODIPINE BESYLATE 5 MG PO TABS
5.0000 mg | ORAL_TABLET | Freq: Every day | ORAL | 3 refills | Status: AC
  Filled 2023-11-11: qty 90, 90d supply, fill #0
  Filled 2024-04-07: qty 100, 100d supply, fill #0

## 2023-11-11 MED ORDER — SILDENAFIL CITRATE 100 MG PO TABS
50.0000 mg | ORAL_TABLET | ORAL | 11 refills | Status: AC | PRN
  Filled 2023-11-11: qty 30, 30d supply, fill #0

## 2023-11-11 MED ORDER — ROSUVASTATIN CALCIUM 20 MG PO TABS
20.0000 mg | ORAL_TABLET | Freq: Every day | ORAL | 3 refills | Status: AC
  Filled 2023-11-11: qty 90, 90d supply, fill #0

## 2023-12-28 ENCOUNTER — Other Ambulatory Visit (HOSPITAL_COMMUNITY): Payer: Self-pay

## 2024-03-08 DIAGNOSIS — H25811 Combined forms of age-related cataract, right eye: Secondary | ICD-10-CM | POA: Diagnosis not present

## 2024-03-08 DIAGNOSIS — H43811 Vitreous degeneration, right eye: Secondary | ICD-10-CM | POA: Diagnosis not present

## 2024-03-08 DIAGNOSIS — H04123 Dry eye syndrome of bilateral lacrimal glands: Secondary | ICD-10-CM | POA: Diagnosis not present

## 2024-03-08 DIAGNOSIS — E119 Type 2 diabetes mellitus without complications: Secondary | ICD-10-CM | POA: Diagnosis not present

## 2024-03-08 DIAGNOSIS — H40013 Open angle with borderline findings, low risk, bilateral: Secondary | ICD-10-CM | POA: Diagnosis not present

## 2024-03-08 DIAGNOSIS — H1045 Other chronic allergic conjunctivitis: Secondary | ICD-10-CM | POA: Diagnosis not present

## 2024-04-07 ENCOUNTER — Other Ambulatory Visit: Payer: Self-pay

## 2024-04-07 ENCOUNTER — Other Ambulatory Visit (HOSPITAL_COMMUNITY): Payer: Self-pay

## 2024-04-10 ENCOUNTER — Other Ambulatory Visit (HOSPITAL_COMMUNITY): Payer: Self-pay
